# Patient Record
Sex: Female | Born: 1990 | Race: Black or African American | Hispanic: No | Marital: Single | Smoking: Never smoker
Health system: Southern US, Community
[De-identification: ages and names within clinical notes are randomized; demographics above are authoritative.]

## PROBLEM LIST (undated history)

## (undated) DIAGNOSIS — D649 Anemia, unspecified: Secondary | ICD-10-CM

## (undated) DIAGNOSIS — O139 Gestational [pregnancy-induced] hypertension without significant proteinuria, unspecified trimester: Secondary | ICD-10-CM

## (undated) HISTORY — DX: Anemia, unspecified: D64.9

## (undated) HISTORY — PX: NO PAST SURGERIES: SHX2092

## (undated) HISTORY — DX: Gestational (pregnancy-induced) hypertension without significant proteinuria, unspecified trimester: O13.9

---

## 2018-04-16 LAB — CYTOLOGY - PAP: Pap: NEGATIVE

## 2019-11-29 ENCOUNTER — Other Ambulatory Visit: Payer: Self-pay

## 2019-11-29 DIAGNOSIS — Z20822 Contact with and (suspected) exposure to covid-19: Secondary | ICD-10-CM

## 2019-12-01 LAB — NOVEL CORONAVIRUS, NAA: SARS-CoV-2, NAA: NOT DETECTED

## 2021-03-12 ENCOUNTER — Other Ambulatory Visit: Payer: Self-pay

## 2021-03-17 ENCOUNTER — Telehealth: Payer: Self-pay

## 2021-03-17 NOTE — Telephone Encounter (Signed)
Called and spoke with patient in regards a referral order from [facility], Danella Sensing, DO. The referral was to schedule an appointment with a Dietitian.  Patient stated she will call back to schedule the appointment due to work schedule.

## 2021-03-18 ENCOUNTER — Telehealth: Payer: Self-pay | Admitting: Hematology and Oncology

## 2021-03-18 NOTE — Telephone Encounter (Signed)
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 Diana Mcintyre returned my call and has been scheduled to see Dr. Lindi Adie on 4/13 at 4pm. Per pt's request, she needed an appt late in the afternoon due to her work schedule. She's aware to arrive 20 minutes early.

## 2021-03-30 DIAGNOSIS — D563 Thalassemia minor: Secondary | ICD-10-CM | POA: Insufficient documentation

## 2021-03-30 NOTE — Assessment & Plan Note (Deleted)
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

## 2021-03-30 NOTE — Progress Notes (Incomplete)
[facility] CONSULT NOTE  No care team member to display  CHIEF COMPLAINTS/PURPOSE OF CONSULTATION:  Newly diagnosed alpha thalassemia minor  HISTORY OF PRESENTING ILLNESS:  Diana Mcintyre 30 y.o. female is here because of recent diagnosis of alpha thalassemia minor. She is referred by Dr. Lanny Cramp at [facility]. [facility] testing on 02/26/21 showed she is a silent carrier for alpha thalassemia. She presents to the clinic today for initial evaluation.   I reviewed her records extensively and collaborated the history with the patient.  MEDICAL HISTORY:  No past medical history on file.  SURGICAL HISTORY: *** The histories are not reviewed yet. Please review them in the "History" navigator section and refresh this [place].  SOCIAL HISTORY: Social History   Socioeconomic History  . Marital status: Not on file    Spouse name: Not on file  . Number of children: Not on file  . Years of education: Not on file  . Highest education level: Not on file  Occupational History  . Not on file  Tobacco Use  . Smoking status: Not on file  . Smokeless tobacco: Not on file  Substance and Sexual Activity  . Alcohol use: Not on file  . Drug use: Not on file  . Sexual activity: Not on file  Other Topics Concern  . Not on file  Social History Narrative  . Not on file   Social Determinants of Health   Financial Resource Strain: Not on file  Food Insecurity: Not on file  Transportation Needs: Not on file  Physical Activity: Not on file  Stress: Not on file  Social Connections: Not on file  Intimate Partner Violence: Not on file    FAMILY HISTORY: No family history on file.  ALLERGIES:  has no allergies on file.  MEDICATIONS:  No current outpatient medications on file.   No current facility-administered medications for this visit.    REVIEW OF SYSTEMS:   Constitutional: Denies fevers, chills or abnormal night sweats Eyes: Denies blurriness of vision, double  vision or watery eyes Ears, nose, mouth, throat, and face: Denies mucositis or sore throat Respiratory: Denies cough, dyspnea or wheezes Cardiovascular: Denies palpitation, chest discomfort or lower extremity swelling Gastrointestinal:  Denies nausea, heartburn or change in bowel habits Skin: Denies abnormal skin rashes Lymphatics: Denies new lymphadenopathy or easy bruising Neurological:Denies numbness, tingling or new weaknesses Behavioral/Psych: Mood is stable, no new changes  Breast: Denies any palpable lumps or discharge All other systems were reviewed with the patient and are negative.  PHYSICAL EXAMINATION: ECOG PERFORMANCE STATUS: {CHL ONC ECOG [id number]}  There were no vitals filed for this visit. There were no vitals filed for this visit.  GENERAL:alert, no distress and comfortable SKIN: skin color, texture, turgor are normal, no rashes or significant lesions EYES: normal, conjunctiva are pink and non-injected, sclera clear OROPHARYNX:no exudate, no erythema and lips, buccal mucosa, and tongue normal  NECK: supple, thyroid normal size, non-tender, without nodularity LYMPH:  no palpable lymphadenopathy in the cervical, axillary or inguinal LUNGS: clear to auscultation and percussion with normal breathing effort HEART: regular rate & rhythm and no murmurs and no lower extremity edema ABDOMEN:abdomen soft, non-tender and normal bowel sounds Musculoskeletal:no cyanosis of digits and no clubbing  PSYCH: alert & oriented x 3 with fluent speech NEURO: no focal motor/sensory deficits  LABORATORY DATA:  I have reviewed the data as listed No results found for: WBC, HGB, HCT, MCV, PLT No results found for: NA, K, CL, CO2  RADIOGRAPHIC STUDIES:  I have personally reviewed the radiological reports and agreed with the findings in the report.  ASSESSMENT AND PLAN:  No problem-specific Assessment & Plan notes found for this encounter.   All questions were answered. The patient  knows to call the clinic with any problems, questions or concerns.   Rulon Eisenmenger, MD, MPH 03/30/2021    I, Molly Dorshimer, am acting as scribe for Nicholas Lose, MD.  {Add scribe attestation statement}

## 2021-03-31 ENCOUNTER — Telehealth: Payer: Self-pay | Admitting: Physician Assistant

## 2021-03-31 ENCOUNTER — Inpatient Hospital Stay: Payer: BC Managed Care – PPO | Admitting: Hematology and Oncology

## 2021-03-31 DIAGNOSIS — D563 Thalassemia minor: Secondary | ICD-10-CM

## 2021-03-31 NOTE — Telephone Encounter (Signed)
Pt cld to r/s her new hem to 4/15 at 11am to see Murray Hodgkins. Pt aware to arrive 20 minutes early.

## 2021-04-01 NOTE — Progress Notes (Deleted)
[facility] [phone]   [phone]  INITIAL CONSULT NOTE  No care team member to display  Hematological/Oncological History 1) 02/26/2021: [name] Carrier Screen: Silent carrier for alpha thalassemia.  2) 04/01/2021: Establish care with Dede Query PA-C   CHIEF COMPLAINTS/PURPOSE OF CONSULTATION:  "Carrier for alpha thalassemia"  HISTORY OF PRESENTING ILLNESS:  Diana Mcintyre 30 y.o. female with medical history significant for ***  On review of the previous records ***  On exam today ***  MEDICAL HISTORY:  No past medical history on file.  SURGICAL HISTORY: *** The histories are not reviewed yet. Please review them in the "History" navigator section and refresh this [place].  SOCIAL HISTORY: Social History   Socioeconomic History  . Marital status: Not on file    Spouse name: Not on file  . Number of children: Not on file  . Years of education: Not on file  . Highest education level: Not on file  Occupational History  . Not on file  Tobacco Use  . Smoking status: Not on file  . Smokeless tobacco: Not on file  Substance and Sexual Activity  . Alcohol use: Not on file  . Drug use: Not on file  . Sexual activity: Not on file  Other Topics Concern  . Not on file  Social History Narrative  . Not on file   Social Determinants of Health   Financial Resource Strain: Not on file  Food Insecurity: Not on file  Transportation Needs: Not on file  Physical Activity: Not on file  Stress: Not on file  Social Connections: Not on file  Intimate Partner Violence: Not on file    FAMILY HISTORY: No family history on file.  ALLERGIES:  has no allergies on file.  MEDICATIONS:  No current outpatient medications on file.   No current facility-administered medications for this visit.    REVIEW OF SYSTEMS:   Constitutional: ( - ) fevers, ( - )  chills , ( - ) night sweats Eyes: ( - ) blurriness of vision, ( - ) double  vision, ( - ) watery eyes Ears, nose, mouth, throat, and face: ( - ) mucositis, ( - ) sore throat Respiratory: ( - ) cough, ( - ) dyspnea, ( - ) wheezes Cardiovascular: ( - ) palpitation, ( - ) chest discomfort, ( - ) lower extremity swelling Gastrointestinal:  ( - ) nausea, ( - ) heartburn, ( - ) change in bowel habits Skin: ( - ) abnormal skin rashes Lymphatics: ( - ) new lymphadenopathy, ( - ) easy bruising Neurological: ( - ) numbness, ( - ) tingling, ( - ) new weaknesses Behavioral/Psych: ( - ) mood change, ( - ) new changes  All other systems were reviewed with the patient and are negative.  PHYSICAL EXAMINATION: ECOG PERFORMANCE STATUS: {CHL ONC ECOG PS:[id number]}  There were no vitals filed for this visit. There were no vitals filed for this visit.  GENERAL: well appearing *** in NAD  SKIN: skin color, texture, turgor are normal, no rashes or significant lesions EYES: conjunctiva are pink and non-injected, sclera clear OROPHARYNX: no exudate, no erythema; lips, buccal mucosa, and tongue normal  NECK: supple, non-tender LYMPH:  no palpable lymphadenopathy in the cervical, axillary or supraclavicular lymph nodes.  LUNGS: clear to auscultation and percussion with normal breathing effort HEART: regular rate & rhythm and no murmurs and no lower extremity edema ABDOMEN: soft, non-tender, non-distended, normal bowel sounds Musculoskeletal: no cyanosis of digits and no clubbing  PSYCH: alert & oriented x 3, fluent speech NEURO: no focal motor/sensory deficits  LABORATORY DATA:  I have reviewed the data as listed No flowsheet data found.  No flowsheet data found.   PATHOLOGY: ***  BLOOD FILM: *** Review of the peripheral blood smear showed normal appearing white cells with neutrophils that were appropriately lobated and granulated. There was no predominance of bi-lobed or hyper-segmented neutrophils appreciated. No Dohle bodies were noted. There was no left shifting, immature  forms or blasts noted. Lymphocytes remain normal in size without any predominance of large granular lymphocytes. Red cells show no anisopoikilocytosis, macrocytes , microcytes or polychromasia. There were no schistocytes, target cells, echinocytes, acanthocytes, dacrocytes, or stomatocytes.There was no rouleaux formation, nucleated red cells, or intra-cellular inclusions noted. The platelets are normal in size, shape, and color without any clumping evident.  RADIOGRAPHIC STUDIES: I have personally reviewed the radiological images as listed and agreed with the findings in the report. No results found.  ASSESSMENT & PLAN ***  No orders of the defined types were placed in this encounter.   All questions were answered. The patient knows to call the clinic with any problems, questions or concerns.  A total of more than {CHL ONC TIME VISIT - DVVOH:[id number]} were spent on this encounter and over half of that time was spent on counseling and coordination of care as outlined above.    Dede Query, PA-C Department of Hematology/Oncology [facility] Phone: [phone]

## 2021-04-02 ENCOUNTER — Inpatient Hospital Stay: Payer: Self-pay

## 2021-04-02 ENCOUNTER — Inpatient Hospital Stay: Payer: Self-pay | Attending: Hematology and Oncology | Admitting: Physician Assistant

## 2021-05-25 LAB — OB RESULTS CONSOLE ANTIBODY SCREEN: Antibody Screen: NEGATIVE

## 2021-05-25 LAB — OB RESULTS CONSOLE ABO/RH: RH Type: POSITIVE

## 2021-07-14 ENCOUNTER — Encounter: Payer: Self-pay | Admitting: General Practice

## 2021-07-29 ENCOUNTER — Encounter: Payer: Self-pay | Admitting: Obstetrics and Gynecology

## 2021-08-18 ENCOUNTER — Other Ambulatory Visit: Payer: Self-pay

## 2021-08-18 ENCOUNTER — Encounter: Payer: Self-pay | Admitting: Family Medicine

## 2021-08-18 ENCOUNTER — Other Ambulatory Visit (HOSPITAL_COMMUNITY)
Admission: RE | Admit: 2021-08-18 | Discharge: 2021-08-18 | Disposition: A | Discharge status: Home / Self Care | Payer: Medicaid Other | Source: Ambulatory Visit | Attending: Family Medicine | Admitting: Family Medicine

## 2021-08-18 ENCOUNTER — Ambulatory Visit (INDEPENDENT_AMBULATORY_CARE_PROVIDER_SITE_OTHER): Payer: Medicaid Other | Admitting: Family Medicine

## 2021-08-18 VITALS — BP 92/63 | HR 77 | Ht 64.0 in | Wt 193.4 lb

## 2021-08-18 DIAGNOSIS — D563 Thalassemia minor: Secondary | ICD-10-CM

## 2021-08-18 DIAGNOSIS — Z148 Genetic carrier of other disease: Secondary | ICD-10-CM | POA: Insufficient documentation

## 2021-08-18 DIAGNOSIS — Z3402 Encounter for supervision of normal first pregnancy, second trimester: Secondary | ICD-10-CM | POA: Diagnosis present

## 2021-08-18 DIAGNOSIS — Z3A15 15 weeks gestation of pregnancy: Secondary | ICD-10-CM

## 2021-08-18 DIAGNOSIS — Z3400 Encounter for supervision of normal first pregnancy, unspecified trimester: Secondary | ICD-10-CM | POA: Insufficient documentation

## 2021-08-18 MED ORDER — BLOOD PRESSURE KIT DEVI: 1.0000 | 0 refills | Status: DC

## 2021-08-18 NOTE — Patient Instructions (Signed)
Second Trimester of Pregnancy The second trimester of pregnancy is from week 13 through week 27. This is months 4 through 6 of pregnancy. The second trimester is often a time when you feel your best. Your body has adjusted to being pregnant, and you begin to feel better physically. During the second trimester: Morning sickness has lessened or stopped completely. You may have more energy. You may have an increase in appetite. The second trimester is also a time when the unborn baby (fetus) is growing rapidly. At the end of the sixth month, the fetus may be up to 12 inches long and weigh about 1 pounds. You will likely begin to feel the baby move (quickening) between 16 and 20 weeks of pregnancy. Body changes during your second trimester Your body continues to go through many changes during your second trimester. The changes vary and generally return to normal after the baby is born. Physical changes Your weight will continue to increase. You will notice your lower abdomen bulging out. You may begin to get stretch marks on your hips, abdomen, and breasts. Your breasts will continue to grow and to become tender. Dark spots or blotches (chloasma or mask of pregnancy) may develop on your face. A dark line from your belly button to the pubic area (linea nigra) may appear. You may have changes in your hair. These can include thickening of your hair, rapid growth, and changes in texture. Some people also have hair loss during or after pregnancy, or hair that feels dry or thin. Health changes You may develop headaches. You may have heartburn. You may develop constipation. You may develop hemorrhoids or swollen, bulging veins (varicose veins). Your gums may bleed and may be sensitive to brushing and flossing. You may urinate more often because the fetus is pressing on your bladder. You may have back pain. This is caused by: Weight gain. Pregnancy hormones that are relaxing the joints in your  pelvis. A shift in weight and the muscles that support your balance. Follow these instructions at home: Medicines Follow your health care provider's instructions regarding medicine use. Specific medicines may be either safe or unsafe to take during pregnancy. Do not take any medicines unless approved by your health care provider. Take a prenatal vitamin that contains at least 600 micrograms (mcg) of folic acid. Eating and drinking Eat a healthy diet that includes fresh fruits and vegetables, whole grains, good sources of protein such as meat, eggs, or tofu, and low-fat dairy products. Avoid raw meat and unpasteurized juice, milk, and cheese. These carry germs that can harm you and your baby. You may need to take these actions to prevent or treat constipation: Drink enough fluid to keep your urine pale yellow. Eat foods that are high in fiber, such as beans, whole grains, and fresh fruits and vegetables. Limit foods that are high in fat and processed sugars, such as fried or sweet foods. Activity Exercise only as directed by your health care provider. Most people can continue their usual exercise routine during pregnancy. Try to exercise for 30 minutes at least 5 days a week. Stop exercising if you develop contractions in your uterus. Stop exercising if you develop pain or cramping in the lower abdomen or lower back. Avoid exercising if it is very hot or humid or if you are at a high altitude. Avoid heavy lifting. If you choose to, you may have sex unless your health care provider tells you not to. Relieving pain and discomfort Wear a supportive bra  to prevent discomfort from breast tenderness. Take warm sitz baths to soothe any pain or discomfort caused by hemorrhoids. Use hemorrhoid cream if your health care provider approves. Rest with your legs raised (elevated) if you have leg cramps or low back pain. If you develop varicose veins: Wear support hose as told by your health care  provider. Elevate your feet for 15 minutes, 3-4 times a day. Limit salt in your diet. Safety Wear your seat belt at all times when driving or riding in a car. Talk with your health care provider if someone is verbally or physically abusive to you. Lifestyle Do not use hot tubs, steam rooms, or saunas. Do not douche. Do not use tampons or scented sanitary pads. Avoid cat litter boxes and soil used by cats. These carry germs that can cause birth defects in the baby and possibly loss of the fetus by miscarriage or stillbirth. Do not use herbal remedies, alcohol, illegal drugs, or medicines that are not approved by your health care provider. Chemicals in these products can harm your baby. Do not use any products that contain nicotine or tobacco, such as cigarettes, e-cigarettes, and chewing tobacco. If you need help quitting, ask your health care provider. General instructions During a routine prenatal visit, your health care provider will do a physical exam and other tests. He or she will also discuss your overall health. Keep all follow-up visits. This is important. Ask your health care provider for a referral to a local prenatal education class. Ask for help if you have counseling or nutritional needs during pregnancy. Your health care provider can offer advice or refer you to specialists for help with various needs. Where to find more information American Pregnancy Association: americanpregnancy.[place] and Gynecologists: PoolDevices.com.pt Office on [employer]: KeywordPortfolios.com.br Contact a health care provider if you have: A headache that does not go away when you take medicine. Vision changes or you see spots in front of your eyes. Mild pelvic cramps, pelvic pressure, or nagging pain in the abdominal area. Persistent nausea, vomiting, or diarrhea. A bad-smelling vaginal discharge or foul-smelling urine. Pain when you  urinate. Sudden or extreme swelling of your face, hands, ankles, feet, or legs. A fever. Get help right away if you: Have fluid leaking from your vagina. Have spotting or bleeding from your vagina. Have severe abdominal cramping or pain. Have difficulty breathing. Have chest pain. Have fainting spells. Have not felt your baby move for the time period told by your health care provider. Have new or increased pain, swelling, or redness in an arm or leg. Summary The second trimester of pregnancy is from week 13 through week 27 (months 4 through 6). Do not use herbal remedies, alcohol, illegal drugs, or medicines that are not approved by your health care provider. Chemicals in these products can harm your baby. Exercise only as directed by your health care provider. Most people can continue their usual exercise routine during pregnancy. Keep all follow-up visits. This is important. This information is not intended to replace advice given to you by your health care provider. Make sure you discuss any questions you have with your health care provider. Document Revised: 05/13/2020 Document Reviewed: 03/19/2020 Elsevier Patient Education  2022 [employer].  Contraception Choices Contraception, also called birth control, refers to methods or devices that prevent pregnancy. Hormonal methods Contraceptive implant A contraceptive implant is a thin, plastic tube that contains a hormone that prevents pregnancy. It is different from an intrauterine device (IUD). It  is inserted into the upper part of the arm by a health care provider. Implants can be effective for up to 3 years. Progestin-only injections Progestin-only injections are injections of progestin, a synthetic form of the hormone progesterone. They are given every 3 months by a health care provider. Birth control pills Birth control pills are pills that contain hormones that prevent pregnancy. They must be taken once a day, preferably at the  same time each day. A prescription is needed to use this method of contraception. Birth control patch The birth control patch contains hormones that prevent pregnancy. It is placed on the skin and must be changed once a week for three weeks and removed on the fourth week. A prescription is needed to use this method of contraception. Vaginal ring A vaginal ring contains hormones that prevent pregnancy. It is placed in the vagina for three weeks and removed on the fourth week. After that, the process is repeated with a new ring. A prescription is needed to use this method of contraception. Emergency contraceptive Emergency contraceptives prevent pregnancy after unprotected sex. They come in pill form and can be taken up to 5 days after sex. They work best the sooner they are taken after having sex. Most emergency contraceptives are available without a prescription. This method should not be used as your only form of birth control. Barrier methods Female condom A female condom is a thin sheath that is worn over the penis during sex. Condoms keep sperm from going inside a woman's body. They can be used with a sperm-killing substance (spermicide) to increase their effectiveness. They should be thrown away after one use. Female condom A female condom is a soft, loose-fitting sheath that is put into the vagina before sex. The condom keeps sperm from going inside a woman's body. They should be thrown away after one use. Diaphragm A diaphragm is a soft, dome-shaped barrier. It is inserted into the vagina before sex, along with a spermicide. The diaphragm blocks sperm from entering the uterus, and the spermicide kills sperm. A diaphragm should be left in the vagina for 6-8 hours after sex and removed within 24 hours. A diaphragm is prescribed and fitted by a health care provider. A diaphragm should be replaced every 1-2 years, after giving birth, after gaining more than 15 lb (6.8 kg), and after pelvic  surgery. Cervical cap A cervical cap is a round, soft latex or plastic cup that fits over the cervix. It is inserted into the vagina before sex, along with spermicide. It blocks sperm from entering the uterus. The cap should be left in place for 6-8 hours after sex and removed within 48 hours. A cervical cap must be prescribed and fitted by a health care provider. It should be replaced every 2 years. Sponge A sponge is a soft, circular piece of polyurethane foam with spermicide in it. The sponge helps block sperm from entering the uterus, and the spermicide kills sperm. To use it, you make it wet and then insert it into the vagina. It should be inserted before sex, left in for at least 6 hours after sex, and removed and thrown away within 30 hours. Spermicides Spermicides are chemicals that kill or block sperm from entering the cervix and uterus. They can come as a cream, jelly, suppository, foam, or tablet. A spermicide should be inserted into the vagina with an applicator at least 82-50 minutes before sex to allow time for it to work. The process must be repeated every time  you have sex. Spermicides do not require a prescription. Intrauterine contraception Intrauterine device (IUD) An IUD is a T-shaped device that is put in a woman's uterus. There are two types: Hormone IUD.This type contains progestin, a synthetic form of the hormone progesterone. This type can stay in place for 3-5 years. Copper IUD.This type is wrapped in copper wire. It can stay in place for 10 years. Permanent methods of contraception Female tubal ligation In this method, a woman's fallopian tubes are sealed, tied, or blocked during surgery to prevent eggs from traveling to the uterus. Hysteroscopic sterilization In this method, a small, flexible insert is placed into each fallopian tube. The inserts cause scar tissue to form in the fallopian tubes and block them, so sperm cannot reach an egg. The procedure takes about 3  months to be effective. Another form of birth control must be used during those 3 months. Female sterilization This is a procedure to tie off the tubes that carry sperm (vasectomy). After the procedure, the man can still ejaculate fluid (semen). Another form of birth control must be used for 3 months after the procedure. Natural planning methods Natural family planning In this method, a couple does not have sex on days when the woman could become pregnant. Calendar method In this method, the woman keeps track of the length of each menstrual cycle, identifies the days when pregnancy can happen, and does not have sex on those days. Ovulation method In this method, a couple avoids sex during ovulation. Symptothermal method This method involves not having sex during ovulation. The woman typically checks for ovulation by watching changes in her temperature and in the consistency of cervical mucus. Post-ovulation method In this method, a couple waits to have sex until after ovulation. Where to find more information Centers for Disease Control and Prevention: http://www.wolf.info/ Summary Contraception, also called birth control, refers to methods or devices that prevent pregnancy. Hormonal methods of contraception include implants, injections, pills, patches, vaginal rings, and emergency contraceptives. Barrier methods of contraception can include female condoms, female condoms, diaphragms, cervical caps, sponges, and spermicides. There are two types of IUDs (intrauterine devices). An IUD can be put in a woman's uterus to prevent pregnancy for 3-5 years. Permanent sterilization can be done through a procedure for males and females. Natural family planning methods involve nothaving sex on days when the woman could become pregnant. This information is not intended to replace advice given to you by your health care provider. Make sure you discuss any questions you have with your health care provider. Document  Revised: 05/11/2020 Document Reviewed: 05/11/2020 Elsevier Patient Education  [place].

## 2021-08-18 NOTE — Progress Notes (Signed)
[facility] New OB   Subjective:   Diana Mcintyre is a 30 y.o. G1P0000 at 42w6dby home IUI being seen today for her first obstetrical visit.  Her obstetrical history is significant for  n/a . Patient does intend to breast feed. Pregnancy history fully reviewed.  Patient reports no complaints.  HISTORY: OB History  Gravida Para Term Preterm AB Living  1 0 0 0 0 0  SAB IAB Ectopic Multiple Live Births  0 0 0 0 0    # Outcome Date GA Lbr Len/2nd Weight Sex Delivery Anes PTL Lv  1 Current              Last pap smear was  03/2018 and was  normal.   Past Medical History:  Diagnosis Date   Anemia    History reviewed. No pertinent surgical history. Family History  Adopted: Yes  Problem Relation Age of Onset   Diabetes Paternal Grandfather    Cancer Mother    Diabetes Sister    Social History   Tobacco Use   Smoking status: Never   Smokeless tobacco: Never  Vaping Use   Vaping Use: Never used  Substance Use Topics   Alcohol use: Not Currently    Alcohol/week: 1.0 standard drink    Types: 1 Glasses of wine per week   Drug use: Never   No Known Allergies Current Outpatient Medications on File Prior to Visit  Medication Sig Dispense Refill   Prenatal Vit-Fe Fumarate-FA (MULTIVITAMIN-PRENATAL) 27-0.8 MG TABS tablet Take 1 tablet by mouth daily at 12 noon.     No current facility-administered medications on file prior to visit.     Exam   Vitals:   08/18/21 1400 08/18/21 1405  BP: 92/63   Pulse: 77   Weight: 193 lb 6.4 oz (87.7 kg)   Height:  '5\' 4"'$  (1.626 m)   Fetal Heart Rate (bpm): 140  Uterus:     Pelvic Exam: Perineum: no hemorrhoids, normal perineum   Vulva: normal external genitalia, no lesions   Vagina:  normal mucosa mild amount of white discharge but denies any symptoms   Cervix: no lesions and normal, pap smear done.   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 Encounter for supervision of normal first pregnancy in second trimester Initial labs drawn. Pap collected today Continue prenatal vitamins. Genetic Screening discussed, NIPS: ordered. Ultrasound discussed; fetal anatomic survey: ordered. Problem list reviewed and updated. [facility] and other [facility] Providers was explained to patient; also emphasized that residents, students are part of our team.  2. Carrier of spinal muscular atrophy 3. Alpha thalassemia minor carrier Sperm donor was screened and does not carry these genes  Routine obstetric precautions reviewed. Return in 4 weeks (on  09/15/2021) for Dyad patient, ob visit.

## 2021-08-19 LAB — CYTOLOGY - PAP
Chlamydia: NEGATIVE
Comment: NEGATIVE
Comment: NEGATIVE
Comment: NEGATIVE
Comment: NORMAL
Diagnosis: NEGATIVE
High risk HPV: NEGATIVE
Neisseria Gonorrhea: NEGATIVE
Trichomonas: NEGATIVE

## 2021-08-19 LAB — CBC/D/PLT+RPR+RH+ABO+RUBIGG...
Antibody Screen: NEGATIVE
Basophils Absolute: 0 10*3/uL (ref 0.0–0.2)
Basos: 0 %
EOS (ABSOLUTE): 0 10*3/uL (ref 0.0–0.4)
Eos: 0 %
HCV Ab: <0.1 s/co ratio (ref 0.0–0.9)
HIV Screen 4th Generation wRfx: NONREACTIVE
Hematocrit: 41.2 % (ref 34.0–46.6)
Hemoglobin: 13.7 g/dL (ref 11.1–15.9)
Hepatitis B Surface Ag: NEGATIVE
Immature Grans (Abs): 0 10*3/uL (ref 0.0–0.1)
Immature Granulocytes: 1 %
Lymphocytes Absolute: 1.5 10*3/uL (ref 0.7–3.1)
Lymphs: 21 %
MCH: 26.4 pg — ABNORMAL LOW (ref 26.6–33.0)
MCHC: 33.3 g/dL (ref 31.5–35.7)
MCV: 79 fL (ref 79–97)
Monocytes Absolute: 0.5 10*3/uL (ref 0.1–0.9)
Monocytes: 7 %
Neutrophils Absolute: 5.3 10*3/uL (ref 1.4–7.0)
Neutrophils: 71 %
Platelets: 221 10*3/uL (ref 150–450)
RBC: 5.19 x10E6/uL (ref 3.77–5.28)
RDW: 14.5 % (ref 11.7–15.4)
RPR Ser Ql: NONREACTIVE
Rh Factor: POSITIVE
Rubella Antibodies, IGG: 1.3 index (ref >0.99)
WBC: 7.3 10*3/uL (ref 3.4–10.8)

## 2021-08-19 LAB — HEMOGLOBIN A1C
Est. average glucose Bld gHb Est-mCnc: 103 mg/dL
Hgb A1c MFr Bld: 5.2 % (ref 4.8–5.6)

## 2021-08-19 LAB — HCV INTERPRETATION

## 2021-08-20 LAB — CULTURE, OB URINE

## 2021-08-20 LAB — URINE CULTURE, OB REFLEX

## 2021-09-02 DIAGNOSIS — Z3483 Encounter for supervision of other normal pregnancy, third trimester: Secondary | ICD-10-CM | POA: Diagnosis not present

## 2021-09-02 DIAGNOSIS — Z3482 Encounter for supervision of other normal pregnancy, second trimester: Secondary | ICD-10-CM | POA: Diagnosis not present

## 2021-09-06 ENCOUNTER — Encounter: Payer: Self-pay | Admitting: General Practice

## 2021-09-09 ENCOUNTER — Ambulatory Visit (HOSPITAL_BASED_OUTPATIENT_CLINIC_OR_DEPARTMENT_OTHER): Payer: Medicaid Other | Admitting: Maternal & Fetal Medicine

## 2021-09-09 ENCOUNTER — Other Ambulatory Visit: Payer: Self-pay

## 2021-09-09 ENCOUNTER — Other Ambulatory Visit: Payer: Self-pay | Admitting: Family Medicine

## 2021-09-09 ENCOUNTER — Other Ambulatory Visit: Payer: Self-pay | Admitting: *Deleted

## 2021-09-09 ENCOUNTER — Ambulatory Visit: Payer: Medicaid Other | Attending: Family Medicine

## 2021-09-09 DIAGNOSIS — O283 Abnormal ultrasonic finding on antenatal screening of mother: Secondary | ICD-10-CM | POA: Diagnosis not present

## 2021-09-09 DIAGNOSIS — O43192 Other malformation of placenta, second trimester: Secondary | ICD-10-CM

## 2021-09-09 DIAGNOSIS — Z3402 Encounter for supervision of normal first pregnancy, second trimester: Secondary | ICD-10-CM

## 2021-09-09 NOTE — Progress Notes (Signed)
MFM Brief Note  Diana Mcintyre is a 30 yo G1P0 who is here with a single intrauterine pregnancy for a detailed anatomy due to carrier status for alpha thalassemia and SMA.  She had a low risk NIPS.  Normal anatomy with measurements consistent with dates There is good fetal movement and amniotic fluid volume  An echogenic intracardiac focus was observed today. I discussed that there is no increased risk to the function or structure of the heart. In addition, in the context of a low risk NIPS result the risk for aneuploidy are reduced. There were no additional markers of aneuploidy observed.  I reviewed that an ultrasound is a screening exam and that a diagnostic exam via amnioticenetesis is the only test available to provide a definitive result.   A marginal cord insertion was observed today. The placental insertion was 9 mm from the placental edge. We discussed the association of MCI with fetal growth delays. Therefore we recommend repeat growth every 4-6 weeks.  Follow up growth was scheduled in 4-6 weeks.  I spent 30 minutes with > 50% in face to face consultation.  Diana Ports, MD

## 2021-09-10 ENCOUNTER — Encounter: Payer: Self-pay | Admitting: Family Medicine

## 2021-09-10 DIAGNOSIS — O43192 Other malformation of placenta, second trimester: Secondary | ICD-10-CM | POA: Insufficient documentation

## 2021-09-16 ENCOUNTER — Other Ambulatory Visit: Payer: Self-pay

## 2021-09-16 ENCOUNTER — Ambulatory Visit (INDEPENDENT_AMBULATORY_CARE_PROVIDER_SITE_OTHER): Payer: Medicaid Other | Admitting: Family Medicine

## 2021-09-16 VITALS — BP 110/65 | HR 73 | Wt 202.6 lb

## 2021-09-16 DIAGNOSIS — Z3400 Encounter for supervision of normal first pregnancy, unspecified trimester: Secondary | ICD-10-CM

## 2021-09-16 DIAGNOSIS — O43192 Other malformation of placenta, second trimester: Secondary | ICD-10-CM

## 2021-09-16 DIAGNOSIS — O99210 Obesity complicating pregnancy, unspecified trimester: Secondary | ICD-10-CM | POA: Insufficient documentation

## 2021-09-16 NOTE — Progress Notes (Signed)
   PRENATAL VISIT NOTE  Subjective:  Diana Mcintyre is a 30 y.o. G1P0000 at [redacted]w[redacted]d being seen today for ongoing prenatal care.  She is currently monitored for the following issues for this low-risk pregnancy and has Alpha thalassemia minor; Carrier of spinal muscular atrophy; Supervision of normal first pregnancy, antepartum; Marginal insertion of umbilical cord affecting management of mother in second trimester; and Obesity in pregnancy, antepartum on their problem list.  Patient reports no complaints.  Contractions: Not present. Vag. Bleeding: None.  Movement: Present. Denies leaking of fluid.   The following portions of the patient's history were reviewed and updated as appropriate: allergies, current medications, past family history, past medical history, past social history, past surgical history and problem list.   Objective:   Vitals:   09/16/21 0817  BP: 110/65  Pulse: 73  Weight: 202 lb 9.6 oz (91.9 kg)    Fetal Status: Fetal Heart Rate (bpm): 137   Movement: Present     General:  Alert, oriented and cooperative. Patient is in no acute distress.  Skin: Skin is warm and dry. No rash noted.   Cardiovascular: Normal heart rate noted  Respiratory: Normal respiratory effort, no problems with respiration noted  Abdomen: Soft, gravid, appropriate for gestational age.  Pain/Pressure: Absent     Pelvic: Cervical exam deferred        Extremities: Normal range of motion.  Edema: None  Mental Status: Normal mood and affect. Normal behavior. Normal judgment and thought content.   Assessment and Plan:  Pregnancy: G1P0000 at [redacted]w[redacted]d  1. Marginal insertion of umbilical cord affecting management of mother in second trimester Growth [place] scheduled   2. Supervision of normal first pregnancy, antepartum Up to date Reviewed safety of exercise in pregnancy Reviewed importance of small frequent meals with high protein for steady weight can.  TWG=33 lb 9.6 oz (15.2 kg) which is above goal.  Discussed ways to slow weight gain.  Recommended back health stretches and pregnancy yoga   Preterm labor symptoms and general obstetric precautions including but not limited to vaginal bleeding, contractions, leaking of fluid and fetal movement were reviewed in detail with the patient. Please refer to After Visit Summary for other counseling recommendations.   Return in about 4 weeks (around 10/14/2021) for Routine prenatal care, Dual Care-MB Dyad.  Future Appointments  Date Time Provider [place]  10/08/2021  9:00 AM [facility] NURSE [facility]  10/08/2021  9:15 AM [facility] US2 [facility] [place]    Caren Macadam, MD

## 2021-09-24 ENCOUNTER — Encounter: Payer: Self-pay | Admitting: Radiology

## 2021-10-08 ENCOUNTER — Ambulatory Visit: Payer: Medicaid Other | Attending: Maternal & Fetal Medicine

## 2021-10-08 ENCOUNTER — Other Ambulatory Visit: Payer: Self-pay | Admitting: *Deleted

## 2021-10-08 ENCOUNTER — Other Ambulatory Visit: Payer: Self-pay

## 2021-10-08 ENCOUNTER — Encounter: Payer: Self-pay | Admitting: *Deleted

## 2021-10-08 ENCOUNTER — Ambulatory Visit: Payer: Medicaid Other | Admitting: *Deleted

## 2021-10-08 VITALS — BP 117/60 | HR 75

## 2021-10-08 DIAGNOSIS — Z3400 Encounter for supervision of normal first pregnancy, unspecified trimester: Secondary | ICD-10-CM | POA: Diagnosis present

## 2021-10-08 DIAGNOSIS — O43192 Other malformation of placenta, second trimester: Secondary | ICD-10-CM | POA: Diagnosis not present

## 2021-10-08 DIAGNOSIS — O43199 Other malformation of placenta, unspecified trimester: Secondary | ICD-10-CM

## 2021-10-08 DIAGNOSIS — R638 Other symptoms and signs concerning food and fluid intake: Secondary | ICD-10-CM

## 2021-10-08 DIAGNOSIS — O99210 Obesity complicating pregnancy, unspecified trimester: Secondary | ICD-10-CM

## 2021-10-08 DIAGNOSIS — Z3A23 23 weeks gestation of pregnancy: Secondary | ICD-10-CM | POA: Diagnosis not present

## 2021-10-08 DIAGNOSIS — O358XX0 Maternal care for other (suspected) fetal abnormality and damage, not applicable or unspecified: Secondary | ICD-10-CM

## 2021-10-15 ENCOUNTER — Ambulatory Visit (INDEPENDENT_AMBULATORY_CARE_PROVIDER_SITE_OTHER): Payer: Medicaid Other | Admitting: Family Medicine

## 2021-10-15 ENCOUNTER — Other Ambulatory Visit: Payer: Self-pay

## 2021-10-15 VITALS — BP 105/70 | HR 88 | Wt 205.0 lb

## 2021-10-15 DIAGNOSIS — D563 Thalassemia minor: Secondary | ICD-10-CM

## 2021-10-15 DIAGNOSIS — O43192 Other malformation of placenta, second trimester: Secondary | ICD-10-CM

## 2021-10-15 DIAGNOSIS — Z148 Genetic carrier of other disease: Secondary | ICD-10-CM

## 2021-10-15 DIAGNOSIS — Z3400 Encounter for supervision of normal first pregnancy, unspecified trimester: Secondary | ICD-10-CM

## 2021-10-15 DIAGNOSIS — O99210 Obesity complicating pregnancy, unspecified trimester: Secondary | ICD-10-CM

## 2021-10-15 NOTE — Patient Instructions (Signed)

## 2021-10-15 NOTE — Progress Notes (Signed)
   Subjective:  Diana Mcintyre is a 30 y.o. G1P0000 at [redacted]w[redacted]d being seen today for ongoing prenatal care.  She is currently monitored for the following issues for this high-risk pregnancy and has Alpha thalassemia minor; Carrier of spinal muscular atrophy; Supervision of normal first pregnancy, antepartum; Marginal insertion of umbilical cord affecting management of mother in second trimester; and Obesity in pregnancy, antepartum on their problem list.  Patient reports no complaints.  Contractions: Not present. Vag. Bleeding: None.  Movement: Present. Denies leaking of fluid.   The following portions of the patient's history were reviewed and updated as appropriate: allergies, current medications, past family history, past medical history, past social history, past surgical history and problem list. Problem list updated.  Objective:   Vitals:   10/15/21 1056  BP: 105/70  Pulse: 88  Weight: 205 lb (93 kg)    Fetal Status: Fetal Heart Rate (bpm): 152   Movement: Present     General:  Alert, oriented and cooperative. Patient is in no acute distress.  Skin: Skin is warm and dry. No rash noted.   Cardiovascular: Normal heart rate noted  Respiratory: Normal respiratory effort, no problems with respiration noted  Abdomen: Soft, gravid, appropriate for gestational age. Pain/Pressure: Absent     Pelvic: Vag. Bleeding: None     Cervical exam deferred        Extremities: Normal range of motion.  Edema: None  Mental Status: Normal mood and affect. Normal behavior. Normal judgment and thought content.   Urinalysis:      Assessment and Plan:  Pregnancy: G1P0000 at [redacted]w[redacted]d  1. Supervision of normal first pregnancy, antepartum BP and FHR normal Discussed delivery planning preliminarily, she is interested in an unmedicated birth and possibly not getting an IV Stressed we will support her wishes whatever they are but would strongly recommend an IV given the unpredictable nature of L&D She will  consider Also discussed available pain options in case she decides against an unmedicated birth  2. Obesity in pregnancy, antepartum   3. Marginal insertion of umbilical cord affecting management of mother in second trimester Last growth [place] normal, following w MFM  Preterm labor symptoms and general obstetric precautions including but not limited to vaginal bleeding, contractions, leaking of fluid and fetal movement were reviewed in detail with the patient. Please refer to After Visit Summary for other counseling recommendations.  Return in 4 weeks (on 11/12/2021) for Dyad patient, ob visit, 28 wk labs.   Clarnce Flock, MD

## 2021-10-22 ENCOUNTER — Inpatient Hospital Stay (HOSPITAL_COMMUNITY)
Admission: AD | Admit: 2021-10-22 | Discharge: 2021-10-22 | Disposition: A | Discharge status: Home / Self Care | Payer: Medicaid Other | Attending: Obstetrics & Gynecology | Admitting: Obstetrics & Gynecology

## 2021-10-22 ENCOUNTER — Other Ambulatory Visit: Payer: Self-pay

## 2021-10-22 ENCOUNTER — Encounter (HOSPITAL_COMMUNITY): Payer: Self-pay | Admitting: Obstetrics & Gynecology

## 2021-10-22 DIAGNOSIS — O99210 Obesity complicating pregnancy, unspecified trimester: Secondary | ICD-10-CM

## 2021-10-22 DIAGNOSIS — O1202 Gestational edema, second trimester: Secondary | ICD-10-CM | POA: Diagnosis not present

## 2021-10-22 DIAGNOSIS — Z3400 Encounter for supervision of normal first pregnancy, unspecified trimester: Secondary | ICD-10-CM

## 2021-10-22 DIAGNOSIS — Z3A25 25 weeks gestation of pregnancy: Secondary | ICD-10-CM | POA: Diagnosis not present

## 2021-10-22 DIAGNOSIS — R609 Edema, unspecified: Secondary | ICD-10-CM

## 2021-10-22 LAB — COMPREHENSIVE METABOLIC PANEL
ALT: 14 U/L (ref 0–44)
AST: 24 U/L (ref 15–41)
Albumin: 2.7 g/dL — ABNORMAL LOW (ref 3.5–5.0)
Alkaline Phosphatase: 45 U/L (ref 38–126)
Anion gap: 6 (ref 5–15)
BUN: 6 mg/dL (ref 6–20)
CO2: 21 mmol/L — ABNORMAL LOW (ref 22–32)
Calcium: 8.5 mg/dL — ABNORMAL LOW (ref 8.9–10.3)
Chloride: 106 mmol/L (ref 98–111)
Creatinine, Ser: 0.57 mg/dL (ref 0.44–1.00)
GFR, Estimated: >60 mL/min (ref >60)
Glucose, Bld: 91 mg/dL (ref 70–99)
Potassium: 3.7 mmol/L (ref 3.5–5.1)
Sodium: 133 mmol/L — ABNORMAL LOW (ref 135–145)
Total Bilirubin: 0.3 mg/dL (ref 0.3–1.2)
Total Protein: 6 g/dL — ABNORMAL LOW (ref 6.5–8.1)

## 2021-10-22 LAB — URINALYSIS, ROUTINE W REFLEX MICROSCOPIC
Bilirubin Urine: NEGATIVE
Glucose, UA: NEGATIVE mg/dL
Hgb urine dipstick: NEGATIVE
Ketones, ur: NEGATIVE mg/dL
Leukocytes,Ua: NEGATIVE
Nitrite: NEGATIVE
Protein, ur: NEGATIVE mg/dL
Specific Gravity, Urine: 1.014 (ref 1.005–1.030)
pH: 6 (ref 5.0–8.0)

## 2021-10-22 LAB — CBC
HCT: 34.4 % — ABNORMAL LOW (ref 36.0–46.0)
Hemoglobin: 11.3 g/dL — ABNORMAL LOW (ref 12.0–15.0)
MCH: 26.5 pg (ref 26.0–34.0)
MCHC: 32.8 g/dL (ref 30.0–36.0)
MCV: 80.8 fL (ref 80.0–100.0)
Platelets: 181 10*3/uL (ref 150–400)
RBC: 4.26 MIL/uL (ref 3.87–5.11)
RDW: 13.2 % (ref 11.5–15.5)
WBC: 7.8 10*3/uL (ref 4.0–10.5)
nRBC: 0 % (ref 0.0–0.2)

## 2021-10-22 LAB — PROTEIN / CREATININE RATIO, URINE
Creatinine, Urine: 127.08 mg/dL
Protein Creatinine Ratio: 0.09 mg/mg{Cre} (ref 0.00–0.15)
Total Protein, Urine: 11 mg/dL

## 2021-10-22 NOTE — MAU Provider Note (Signed)
History     CSN: [id number]  Arrival date and time: 10/22/21 1424   Event Date/Time   First Provider Initiated Contact with Patient 10/22/21 1500      Chief Complaint  Patient presents with   swelling   HPI Diana Mcintyre is a 30 y.o. G1P0 at 75w1dhere for swelling in ankles and hands. Patient reports she noticed an increase in swelling this morning, however since then she feels as if swelling has decreased. She denies any BP issues, no headache, vision changes, or RUQ/epigastric pain. Denies contractions, leaking fluid, or vaginal bleeding. Endorses active fetal movement.   OB History     Gravida  1   Para  0   Term  0   Preterm  0   AB  0   Living  0      SAB  0   IAB  0   Ectopic  0   Multiple  0   Live Births  0           Past Medical History:  Diagnosis Date   Anemia     Past Surgical History:  Procedure Laterality Date   NO PAST SURGERIES      Family History  Adopted: Yes  Problem Relation Age of Onset   Diabetes Paternal Grandfather    Cancer Mother    Diabetes Sister     Social History   Tobacco Use   Smoking status: Never   Smokeless tobacco: Never  Vaping Use   Vaping Use: Never used  Substance Use Topics   Alcohol use: Not Currently    Alcohol/week: 1.0 standard drink    Types: 1 Glasses of wine per week   Drug use: Never    Allergies: No Known Allergies  Medications Prior to Admission  Medication Sig Dispense Refill Last Dose   Blood Pressure Monitoring (BLOOD PRESSURE KIT) DEVI 1 Device by Does not apply route once a week. 1 each 0    Prenatal Vit-Fe Fumarate-FA (MULTIVITAMIN-PRENATAL) 27-0.8 MG TABS tablet Take 1 tablet by mouth daily at 12 noon.       Review of Systems  Constitutional: Negative.   Respiratory: Negative.    Cardiovascular:        Swelling in ankles and hands   Gastrointestinal: Negative.   Genitourinary: Negative.   Musculoskeletal: Negative.   Neurological: Negative.   Physical Exam    Blood pressure 116/60, pulse 78, temperature 99.2 F (37.3 C), temperature source Oral, resp. rate 16, last menstrual period 04/29/2021, SpO2 100 %.  Physical Exam Constitutional:      Appearance: She is obese.  HENT:     Head: Normocephalic and atraumatic.  Eyes:     Pupils: Pupils are equal, round, and reactive to light.  Cardiovascular:     Rate and Rhythm: Normal rate.  Pulmonary:     Effort: Pulmonary effort is normal.  Abdominal:     General: Abdomen is flat.     Palpations: Abdomen is soft.  Musculoskeletal:        General: Swelling (+1 swelling in lower extremities) present. Normal range of motion.  Skin:    General: Skin is warm and dry.  Neurological:     General: No focal deficit present.     Mental Status: She is alert and oriented to person, place, and time.  Psychiatric:        Mood and Affect: Mood normal.        Behavior: Behavior normal.  Thought Content: Thought content normal.   NST FHR: 140 bpm, moderate variability, +accels, no decels Toco: quiet MAU Course  Procedures  MDM CBC, CMP, UPCR wnl BP's normotensive NST reactive and reassuring for gestational age  Assessment and Plan  Swelling [redacted] weeks gestation of pregnancy  - Recommend elevate legs, hydrate, compression socks - Keep OB appointment as scheduled - Strict return precautions reviewed. Return to MAU as needed for worsening symptoms   Renee Harder, CNM 10/22/2021, 4:49 PM

## 2021-10-22 NOTE — MAU Note (Signed)
Diana Mcintyre is a 30 y.o. at [redacted]w[redacted]d here in MAU reporting: increased swelling in hands and ankles since last night. No HTN. No pain, bleeding, or LOF. +FM  Onset of complaint: last night  Pain score: 0/10  Vitals:   10/22/21 1447  BP: 117/68  Pulse: 82  Resp: 16  Temp: 99.2 F (37.3 C)  SpO2: 98%     FHT:efm applied in room  Lab orders placed from triage: UA

## 2021-11-01 ENCOUNTER — Encounter: Payer: Self-pay | Admitting: *Deleted

## 2021-11-19 ENCOUNTER — Other Ambulatory Visit: Payer: Self-pay

## 2021-11-19 ENCOUNTER — Ambulatory Visit (INDEPENDENT_AMBULATORY_CARE_PROVIDER_SITE_OTHER): Payer: Medicaid Other | Admitting: Family Medicine

## 2021-11-19 ENCOUNTER — Other Ambulatory Visit: Payer: Medicaid Other

## 2021-11-19 VITALS — BP 101/63 | HR 87 | Wt 218.2 lb

## 2021-11-19 DIAGNOSIS — O43192 Other malformation of placenta, second trimester: Secondary | ICD-10-CM

## 2021-11-19 DIAGNOSIS — Z3400 Encounter for supervision of normal first pregnancy, unspecified trimester: Secondary | ICD-10-CM

## 2021-11-19 DIAGNOSIS — O99210 Obesity complicating pregnancy, unspecified trimester: Secondary | ICD-10-CM

## 2021-11-19 DIAGNOSIS — Z148 Genetic carrier of other disease: Secondary | ICD-10-CM

## 2021-11-19 DIAGNOSIS — D563 Thalassemia minor: Secondary | ICD-10-CM

## 2021-11-19 NOTE — Progress Notes (Signed)
   Subjective:  Diana Mcintyre is a 30 y.o. G1P0000 at [redacted]w[redacted]d being seen today for ongoing prenatal care.  She is currently monitored for the following issues for this low-risk pregnancy and has Alpha thalassemia minor; Carrier of spinal muscular atrophy; Supervision of normal first pregnancy, antepartum; Marginal insertion of umbilical cord affecting management of mother in second trimester; and Obesity in pregnancy, antepartum on their problem list.  Patient reports no complaints.  Contractions: Not present. Vag. Bleeding: None.  Movement: Present. Denies leaking of fluid.   The following portions of the patient's history were reviewed and updated as appropriate: allergies, current medications, past family history, past medical history, past social history, past surgical history and problem list. Problem list updated.  Objective:   Vitals:   11/19/21 0835  BP: 101/63  Pulse: 87  Weight: 218 lb 3.2 oz (99 kg)    Fetal Status: Fetal Heart Rate (bpm): 156   Movement: Present     General:  Alert, oriented and cooperative. Patient is in no acute distress.  Skin: Skin is warm and dry. No rash noted.   Cardiovascular: Normal heart rate noted  Respiratory: Normal respiratory effort, no problems with respiration noted  Abdomen: Soft, gravid, appropriate for gestational age. Pain/Pressure: Absent     Pelvic: Vag. Bleeding: None     Cervical exam deferred        Extremities: Normal range of motion.  Edema: None  Mental Status: Normal mood and affect. Normal behavior. Normal judgment and thought content.   Urinalysis:      Assessment and Plan:  Pregnancy: G1P0000 at [redacted]w[redacted]d  1. Supervision of normal first pregnancy, antepartum BP and FHR normal 28wk labs today Undecided on TDaP, will let us know at next visit - Glucose Tolerance, 2 Hours w/1 Hour - CBC - RPR - Antibody screen; Future - HIV  2. Marginal insertion of umbilical cord affecting management of mother in second  trimester Following w MFM EFW normal to date  3. Obesity in pregnancy, antepartum   4. Carrier of spinal muscular atrophy   5. Alpha thalassemia minor   Preterm labor symptoms and general obstetric precautions including but not limited to vaginal bleeding, contractions, leaking of fluid and fetal movement were reviewed in detail with the patient. Please refer to After Visit Summary for other counseling recommendations.  Return in 2 weeks (on 12/03/2021) for Dyad patient, ob visit.   Clarnce Flock, MD

## 2021-11-20 LAB — CBC
Hematocrit: 36 % (ref 34.0–46.6)
Hemoglobin: 11.6 g/dL (ref 11.1–15.9)
MCH: 25.6 pg — ABNORMAL LOW (ref 26.6–33.0)
MCHC: 32.2 g/dL (ref 31.5–35.7)
MCV: 80 fL (ref 79–97)
Platelets: 195 10*3/uL (ref 150–450)
RBC: 4.53 x10E6/uL (ref 3.77–5.28)
RDW: 13.3 % (ref 11.7–15.4)
WBC: 7.4 10*3/uL (ref 3.4–10.8)

## 2021-11-20 LAB — GLUCOSE TOLERANCE, 2 HOURS W/ 1HR
Glucose, 1 hour: 140 mg/dL (ref 70–179)
Glucose, 2 hour: 94 mg/dL (ref 70–152)
Glucose, Fasting: 86 mg/dL (ref 70–91)

## 2021-11-20 LAB — SYPHILIS: RPR W/REFLEX TO RPR TITER AND TREPONEMAL ANTIBODIES, TRADITIONAL SCREENING AND DIAGNOSIS ALGORITHM: RPR Ser Ql: NONREACTIVE

## 2021-12-03 ENCOUNTER — Ambulatory Visit: Payer: Medicaid Other

## 2021-12-06 ENCOUNTER — Ambulatory Visit (INDEPENDENT_AMBULATORY_CARE_PROVIDER_SITE_OTHER): Payer: Medicaid Other | Admitting: Family Medicine

## 2021-12-06 ENCOUNTER — Other Ambulatory Visit: Payer: Self-pay

## 2021-12-06 ENCOUNTER — Encounter: Payer: Self-pay | Admitting: Family Medicine

## 2021-12-06 VITALS — BP 114/74 | HR 87 | Wt 222.0 lb

## 2021-12-06 DIAGNOSIS — O99210 Obesity complicating pregnancy, unspecified trimester: Secondary | ICD-10-CM

## 2021-12-06 DIAGNOSIS — Z3400 Encounter for supervision of normal first pregnancy, unspecified trimester: Secondary | ICD-10-CM

## 2021-12-06 DIAGNOSIS — O43192 Other malformation of placenta, second trimester: Secondary | ICD-10-CM

## 2021-12-06 DIAGNOSIS — D563 Thalassemia minor: Secondary | ICD-10-CM

## 2021-12-06 DIAGNOSIS — Z148 Genetic carrier of other disease: Secondary | ICD-10-CM

## 2021-12-06 NOTE — Patient Instructions (Addendum)
We recommend childbirth education to help your cope and plan for labor.   [place] has free or nearly free classes that you are able to sign up online. They have a combination of virtual and in person classes. Additionally they offer breastfeeding specific classes, infant CPR classes and classes for partners and grandparents. The classes are held on the [place] and [place] campuses.   Childbirth Education Options:  [facility] Department Classes:  Childbirth education classes can help you get ready for a positive parenting experience. You can also meet other expectant parents and get free stuff for your baby. Each class runs for five weeks on the same night and costs $45 for the mother-to-be and her support person. Medicaid covers the cost if you are eligible. Call [phone] to register.  [place] Childbirth Education: Classes can vary in availability and schedule is subject to change. For most up-to-date information please visit [email or web address] to review and register.  [employer]   There are also many childbirth education books. The book entitled "The Birth Partner"  by Jabier Mutton is a text that is used to train doulas (trained birth support persons) and is a good Theatre manager for all families.

## 2021-12-06 NOTE — Progress Notes (Signed)
° °  PRENATAL VISIT NOTE  Subjective:  Diana Mcintyre is a 30 y.o. G1P0000 at [redacted]w[redacted]d being seen today for ongoing prenatal care.  She is currently monitored for the following issues for this low-risk pregnancy and has Alpha thalassemia minor; Carrier of spinal muscular atrophy; Supervision of normal first pregnancy, antepartum; Marginal insertion of umbilical cord affecting management of mother in second trimester; and Obesity in pregnancy, antepartum on their problem list.  Patient reports no complaints.  Contractions: Not present. Vag. Bleeding: None.  Movement: Present. Denies leaking of fluid.   The following portions of the patient's history were reviewed and updated as appropriate: allergies, current medications, past family history, past medical history, past social history, past surgical history and problem list.   Objective:   Vitals:   12/06/21 0835  BP: 114/74  Pulse: 87  Weight: 222 lb (100.7 kg)    Fetal Status: Fetal Heart Rate (bpm): 145   Movement: Present     General:  Alert, oriented and cooperative. Patient is in no acute distress.  Skin: Skin is warm and dry. No rash noted.   Cardiovascular: Normal heart rate noted  Respiratory: Normal respiratory effort, no problems with respiration noted  Abdomen: Soft, gravid, appropriate for gestational age.  Pain/Pressure: Absent     Pelvic: Cervical exam deferred        Extremities: Normal range of motion.  Edema: None  Mental Status: Normal mood and affect. Normal behavior. Normal judgment and thought content.   Assessment and Plan:  Pregnancy: G1P0000 at [redacted]w[redacted]d 1. Supervision of normal first pregnancy, antepartum Up to date Having some swelling in hands. BP is WNL. Counseled to monitor sx  2. Obesity in pregnancy, antepartum TWG=53 lb (24 kg) which is well above goal for weight  3. Marginal insertion of umbilical cord affecting management of mother in second trimester FH is appropriate Has [place] scheduled  4. Carrier of  spinal muscular atrophy  5. Alpha thalassemia minor   Preterm labor symptoms and general obstetric precautions including but not limited to vaginal bleeding, contractions, leaking of fluid and fetal movement were reviewed in detail with the patient. Please refer to After Visit Summary for other counseling recommendations.   Return in about 2 weeks (around 12/20/2021) for Mom+Baby Combined Care, Routine prenatal care.  Future Appointments  Date Time Provider [place]  12/10/2021  2:30 PM [facility] NURSE [facility]  12/10/2021  2:45 PM [facility] US4 [facility]  12/21/2021  8:15 AM [facility]  01/11/2022  8:35 AM [facility]  01/18/2022  8:35 AM [facility]AD [facility]  01/25/2022  8:15 AM [facility]AD [facility] [place]  02/02/2022  8:15 AM [facility]ABY[facility]AD [facility] [place]    Caren Macadam, MD

## 2021-12-10 ENCOUNTER — Ambulatory Visit: Payer: Medicaid Other | Attending: Maternal & Fetal Medicine | Admitting: *Deleted

## 2021-12-10 ENCOUNTER — Encounter: Payer: Self-pay | Admitting: *Deleted

## 2021-12-10 ENCOUNTER — Other Ambulatory Visit: Payer: Self-pay | Admitting: *Deleted

## 2021-12-10 ENCOUNTER — Ambulatory Visit (HOSPITAL_BASED_OUTPATIENT_CLINIC_OR_DEPARTMENT_OTHER): Payer: Medicaid Other

## 2021-12-10 ENCOUNTER — Other Ambulatory Visit: Payer: Self-pay

## 2021-12-10 VITALS — BP 127/67 | HR 62

## 2021-12-10 DIAGNOSIS — R638 Other symptoms and signs concerning food and fluid intake: Secondary | ICD-10-CM

## 2021-12-10 DIAGNOSIS — O43199 Other malformation of placenta, unspecified trimester: Secondary | ICD-10-CM

## 2021-12-10 DIAGNOSIS — O43193 Other malformation of placenta, third trimester: Secondary | ICD-10-CM | POA: Insufficient documentation

## 2021-12-10 DIAGNOSIS — Z148 Genetic carrier of other disease: Secondary | ICD-10-CM | POA: Insufficient documentation

## 2021-12-10 DIAGNOSIS — Z3400 Encounter for supervision of normal first pregnancy, unspecified trimester: Secondary | ICD-10-CM

## 2021-12-10 DIAGNOSIS — O3413 Maternal care for benign tumor of corpus uteri, third trimester: Secondary | ICD-10-CM | POA: Insufficient documentation

## 2021-12-10 DIAGNOSIS — Z3A32 32 weeks gestation of pregnancy: Secondary | ICD-10-CM | POA: Insufficient documentation

## 2021-12-10 DIAGNOSIS — D259 Leiomyoma of uterus, unspecified: Secondary | ICD-10-CM | POA: Diagnosis not present

## 2021-12-10 DIAGNOSIS — O99210 Obesity complicating pregnancy, unspecified trimester: Secondary | ICD-10-CM

## 2021-12-10 DIAGNOSIS — Z362 Encounter for other antenatal screening follow-up: Secondary | ICD-10-CM | POA: Insufficient documentation

## 2021-12-19 NOTE — L&D Delivery Note (Signed)
Delivery Note I arrived at the room while mom was pushing effectively. I pushed with the patient and infant came to a full crown and mother paused to wait for next contraction. FHR went to high 70s and rebounded to 120s. At the next contraction, [place] delivered head, shoulders and body in one push. There was a shawl cord. Mother cut the cord with Mother Lawerance Bach taking photos.   At 8:52 AM a viable female was delivered via Vaginal, Spontaneous (Presentation: Right Occiput Anterior).  APGAR: 8, 9; weight - pending .   Placenta status: Spontaneous, Intact.  Cord: 3 vessels with the following complications: None.  Cord pH: not collected  Anesthesia: Epidural Episiotomy: None Lacerations: 1st degree Suture Repair: 3.0 vicryl Est. Blood Loss (mL):  260mL  Mom to postpartum.  Baby to Couplet care / Skin to Skin.  Juanita Craver [facility] 01/21/2022, 9:21 AM

## 2021-12-21 ENCOUNTER — Ambulatory Visit (INDEPENDENT_AMBULATORY_CARE_PROVIDER_SITE_OTHER): Payer: Medicaid Other | Admitting: Family Medicine

## 2021-12-21 ENCOUNTER — Other Ambulatory Visit: Payer: Self-pay

## 2021-12-21 VITALS — BP 116/80 | HR 80 | Wt 227.2 lb

## 2021-12-21 DIAGNOSIS — O99210 Obesity complicating pregnancy, unspecified trimester: Secondary | ICD-10-CM

## 2021-12-21 DIAGNOSIS — O43192 Other malformation of placenta, second trimester: Secondary | ICD-10-CM

## 2021-12-21 DIAGNOSIS — Z3400 Encounter for supervision of normal first pregnancy, unspecified trimester: Secondary | ICD-10-CM

## 2021-12-21 NOTE — Progress Notes (Signed)
° °  Subjective:  Diana Mcintyre is a 31 y.o. G1P0000 at [redacted]w[redacted]d being seen today for ongoing prenatal care.  She is currently monitored for the following issues for this low-risk pregnancy and has Alpha thalassemia minor; Carrier of spinal muscular atrophy; Supervision of normal first pregnancy, antepartum; Marginal insertion of umbilical cord affecting management of mother in second trimester; and Obesity in pregnancy, antepartum on their problem list.  Patient reports no complaints.  Contractions: Not present. Vag. Bleeding: None.  Movement: Present. Denies leaking of fluid.   The following portions of the patient's history were reviewed and updated as appropriate: allergies, current medications, past family history, past medical history, past social history, past surgical history and problem list. Problem list updated.  Objective:   Vitals:   12/21/21 0820  BP: 116/80  Pulse: 80  Weight: 227 lb 3.2 oz (103.1 kg)    Fetal Status: Fetal Heart Rate (bpm): 160   Movement: Present     General:  Alert, oriented and cooperative. Patient is in no acute distress.  Skin: Skin is warm and dry. No rash noted.   Cardiovascular: Normal heart rate noted  Respiratory: Normal respiratory effort, no problems with respiration noted  Abdomen: Soft, gravid, appropriate for gestational age. Pain/Pressure: Absent     Pelvic: Vag. Bleeding: None     Cervical exam deferred        Extremities: Normal range of motion.  Edema: None  Mental Status: Normal mood and affect. Normal behavior. Normal judgment and thought content.   Urinalysis:      Assessment and Plan:  Pregnancy: G1P0000 at [redacted]w[redacted]d  1. Supervision of normal first pregnancy, antepartum BP and FHR normal Discussed labor, she still desires to have a low intervention birth, unsure about IV even saline locked We discussed risks and benefits as well as importance of being flexible with the unpredictable nature of L&D  2. Obesity in pregnancy,  antepartum   3. Marginal insertion of umbilical cord affecting management of mother in second trimester Following w MFM for growth scans Last [place] 12/10/21, EFW 1875g 33% Follow up scheduled for later this month  Preterm labor symptoms and general obstetric precautions including but not limited to vaginal bleeding, contractions, leaking of fluid and fetal movement were reviewed in detail with the patient. Please refer to After Visit Summary for other counseling recommendations.  Return in 2 weeks (on 01/04/2022) for Dyad patient, ob visit.   Clarnce Flock, MD

## 2021-12-30 ENCOUNTER — Ambulatory Visit (INDEPENDENT_AMBULATORY_CARE_PROVIDER_SITE_OTHER): Payer: Medicaid Other

## 2021-12-30 ENCOUNTER — Other Ambulatory Visit: Payer: Self-pay

## 2021-12-30 ENCOUNTER — Encounter: Payer: Self-pay | Admitting: Family Medicine

## 2021-12-30 VITALS — BP 126/85 | HR 72 | Wt 227.0 lb

## 2021-12-30 DIAGNOSIS — Z3400 Encounter for supervision of normal first pregnancy, unspecified trimester: Secondary | ICD-10-CM

## 2021-12-30 DIAGNOSIS — O99210 Obesity complicating pregnancy, unspecified trimester: Secondary | ICD-10-CM

## 2021-12-30 NOTE — Progress Notes (Signed)
Pt here today with c/o swelling in feet and hands with mild headache x 5-6 days. Pt denies any visual changes or vaginal bleeding or abd pain at this time.   [name] BP today is 126/85 Pt has not taken Tylenol for headache. Has been eating and drinking normally.   Pt also states having decreased fetal movement since Sat. Pt states has been having movements but not as often or frequent as they were. Pt placed on monitor for NST.   Colletta Maryland, RN   Reactive NST reviewed by Dr Ilda Basset. Pt ok for discharge. Keep next OB appt on 01/07/22. Pt advised fetal kick counts. Pt verbalized understanding and agreeable to plan of care.  Diana Mcintyre

## 2022-01-07 ENCOUNTER — Ambulatory Visit (INDEPENDENT_AMBULATORY_CARE_PROVIDER_SITE_OTHER): Payer: Medicaid Other | Admitting: Family Medicine

## 2022-01-07 ENCOUNTER — Encounter: Payer: Self-pay | Admitting: *Deleted

## 2022-01-07 ENCOUNTER — Other Ambulatory Visit (HOSPITAL_COMMUNITY)
Admission: RE | Admit: 2022-01-07 | Discharge: 2022-01-07 | Disposition: A | Discharge status: Home / Self Care | Payer: Medicaid Other | Source: Ambulatory Visit | Attending: Family Medicine | Admitting: Family Medicine

## 2022-01-07 ENCOUNTER — Ambulatory Visit: Payer: Medicaid Other | Admitting: *Deleted

## 2022-01-07 ENCOUNTER — Ambulatory Visit: Payer: Medicaid Other | Attending: Obstetrics and Gynecology

## 2022-01-07 ENCOUNTER — Other Ambulatory Visit: Payer: Self-pay

## 2022-01-07 VITALS — BP 117/80 | HR 103

## 2022-01-07 VITALS — BP 137/85 | HR 98 | Wt 236.7 lb

## 2022-01-07 DIAGNOSIS — Z3A36 36 weeks gestation of pregnancy: Secondary | ICD-10-CM | POA: Insufficient documentation

## 2022-01-07 DIAGNOSIS — D563 Thalassemia minor: Secondary | ICD-10-CM | POA: Diagnosis not present

## 2022-01-07 DIAGNOSIS — O43123 Velamentous insertion of umbilical cord, third trimester: Secondary | ICD-10-CM | POA: Insufficient documentation

## 2022-01-07 DIAGNOSIS — D259 Leiomyoma of uterus, unspecified: Secondary | ICD-10-CM | POA: Diagnosis not present

## 2022-01-07 DIAGNOSIS — O43192 Other malformation of placenta, second trimester: Secondary | ICD-10-CM

## 2022-01-07 DIAGNOSIS — O99213 Obesity complicating pregnancy, third trimester: Secondary | ICD-10-CM

## 2022-01-07 DIAGNOSIS — O99210 Obesity complicating pregnancy, unspecified trimester: Secondary | ICD-10-CM

## 2022-01-07 DIAGNOSIS — Z3400 Encounter for supervision of normal first pregnancy, unspecified trimester: Secondary | ICD-10-CM

## 2022-01-07 DIAGNOSIS — Z148 Genetic carrier of other disease: Secondary | ICD-10-CM

## 2022-01-07 DIAGNOSIS — O43193 Other malformation of placenta, third trimester: Secondary | ICD-10-CM

## 2022-01-07 DIAGNOSIS — O3413 Maternal care for benign tumor of corpus uteri, third trimester: Secondary | ICD-10-CM | POA: Insufficient documentation

## 2022-01-07 LAB — OB RESULTS CONSOLE GC/CHLAMYDIA: Gonorrhea: NEGATIVE

## 2022-01-07 NOTE — Patient Instructions (Signed)

## 2022-01-07 NOTE — Progress Notes (Signed)
° ° °  Subjective:  Diana Mcintyre is a 31 y.o. G1P0000 at [redacted]w[redacted]d being seen today for ongoing prenatal care.  She is currently monitored for the following issues for this high-risk pregnancy and has Alpha thalassemia minor; Carrier of spinal muscular atrophy; Supervision of normal first pregnancy, antepartum; Marginal insertion of umbilical cord affecting management of mother in second trimester; and Obesity in pregnancy, antepartum on their problem list.  Patient reports no complaints.  Contractions: Not present. Vag. Bleeding: None.  Movement: Present. Denies leaking of fluid.   The following portions of the patient's history were reviewed and updated as appropriate: allergies, current medications, past family history, past medical history, past social history, past surgical history and problem list. Problem list updated.  Objective:   Vitals:   01/07/22 1007  BP: 137/85  Pulse: 98  Weight: 236 lb 11.2 oz (107.4 kg)    Fetal Status:     Movement: Present  Presentation: Vertex  General:  Alert, oriented and cooperative. Patient is in no acute distress.  Skin: Skin is warm and dry. No rash noted.   Cardiovascular: Normal heart rate noted  Respiratory: Normal respiratory effort, no problems with respiration noted  Abdomen: Soft, gravid, appropriate for gestational age. Pain/Pressure: Absent     Pelvic: Vag. Bleeding: None     Cervical exam performed Dilation: Closed Effacement (%): Thick Station: -3  Extremities: Normal range of motion.  Edema: Trace  Mental Status: Normal mood and affect. Normal behavior. Normal judgment and thought content.   Urinalysis:      Assessment and Plan:  Pregnancy: G1P0000 at [redacted]w[redacted]d  1. Supervision of normal first pregnancy, antepartum BP and FHR normal Swabs today - GC/Chlamydia probe amp ([place])not at [facility] - Culture, beta strep (group b only)  2. Obesity in pregnancy, antepartum   3. Marginal insertion of umbilical cord affecting management  of mother in second trimester Last growth [place] earlier today, EFW 15% No further scans indicated  Preterm labor symptoms and general obstetric precautions including but not limited to vaginal bleeding, contractions, leaking of fluid and fetal movement were reviewed in detail with the patient. Please refer to After Visit Summary for other counseling recommendations.  Return in 1 week (on 01/14/2022) for Dyad patient, ob visit.   Clarnce Flock, MD

## 2022-01-08 ENCOUNTER — Encounter: Payer: Self-pay | Admitting: Radiology

## 2022-01-10 LAB — GC/CHLAMYDIA PROBE AMP (~~LOC~~) NOT AT ARMC
Chlamydia: NEGATIVE
Comment: NEGATIVE
Comment: NORMAL
Neisseria Gonorrhea: NEGATIVE

## 2022-01-11 LAB — CULTURE, BETA STREP (GROUP B ONLY): Strep Gp B Culture: NEGATIVE

## 2022-01-18 ENCOUNTER — Ambulatory Visit (INDEPENDENT_AMBULATORY_CARE_PROVIDER_SITE_OTHER): Payer: Medicaid Other | Admitting: Family Medicine

## 2022-01-18 ENCOUNTER — Other Ambulatory Visit: Payer: Self-pay

## 2022-01-18 VITALS — BP 136/93 | HR 66 | Wt 233.1 lb

## 2022-01-18 DIAGNOSIS — R030 Elevated blood-pressure reading, without diagnosis of hypertension: Secondary | ICD-10-CM | POA: Diagnosis not present

## 2022-01-18 DIAGNOSIS — O99210 Obesity complicating pregnancy, unspecified trimester: Secondary | ICD-10-CM

## 2022-01-18 DIAGNOSIS — Z8759 Personal history of other complications of pregnancy, childbirth and the puerperium: Secondary | ICD-10-CM | POA: Insufficient documentation

## 2022-01-18 DIAGNOSIS — Z3400 Encounter for supervision of normal first pregnancy, unspecified trimester: Secondary | ICD-10-CM

## 2022-01-18 DIAGNOSIS — H938X1 Other specified disorders of right ear: Secondary | ICD-10-CM

## 2022-01-18 DIAGNOSIS — O1403 Mild to moderate pre-eclampsia, third trimester: Secondary | ICD-10-CM | POA: Insufficient documentation

## 2022-01-18 MED ORDER — FLUTICASONE PROPIONATE 50 MCG/ACT NA SUSP: 1.0000 | Freq: Every day | NASAL | 2 refills | Status: AC

## 2022-01-18 NOTE — Progress Notes (Signed)
° °  Subjective:  Diana Mcintyre is a 31 y.o. G1P0000 at [redacted]w[redacted]d being seen today for ongoing prenatal care.  She is currently monitored for the following issues for this low-risk pregnancy and has Alpha thalassemia minor; Carrier of spinal muscular atrophy; Supervision of normal first pregnancy, antepartum; Marginal insertion of umbilical cord affecting management of mother in second trimester; Obesity in pregnancy, antepartum; and Elevated blood pressure reading without diagnosis of hypertension on their problem list.  Patient reports no complaints.  Contractions: Not present. Vag. Bleeding: None.  Movement: Present. Denies leaking of fluid.   The following portions of the patient's history were reviewed and updated as appropriate: allergies, current medications, past family history, past medical history, past social history, past surgical history and problem list. Problem list updated.  Objective:   Vitals:   01/18/22 0836  BP: (!) 136/93  Pulse: 66  Weight: 233 lb 1.6 oz (105.7 kg)    Fetal Status: Fetal Heart Rate (bpm): 142   Movement: Present     General:  Alert, oriented and cooperative. Patient is in no acute distress.  Skin: Skin is warm and dry. No rash noted.   Cardiovascular: Normal heart rate noted  Respiratory: Normal respiratory effort, no problems with respiration noted  Abdomen: Soft, gravid, appropriate for gestational age. Pain/Pressure: Absent     Pelvic: Vag. Bleeding: None     Cervical exam deferred        Extremities: Normal range of motion.  Edema: Trace  Mental Status: Normal mood and affect. Normal behavior. Normal judgment and thought content.   Urinalysis:      Assessment and Plan:  Pregnancy: G1P0000 at [redacted]w[redacted]d  1. Supervision of normal first pregnancy, antepartum BP elevated, see below FHR normal Strongly desires 39wk IOL Cervix fingertip, discussed outpatient foley bulb at next week's visit in the afternoon, she is amenable  2. Obesity in pregnancy,  antepartum   3. Elevated blood pressure reading without diagnosis of hypertension Elevated on repeat, asymptomatic, labs ordered - CBC - Comp Met (CMET) - Protein / creatinine ratio, urine  4. Congestion of right ear Trial flonase - fluticasone (FLONASE) 50 MCG/ACT nasal spray; Place 1 spray into both nostrils daily.  Dispense: 18.2 mL; Refill: 2  Term labor symptoms and general obstetric precautions including but not limited to vaginal bleeding, contractions, leaking of fluid and fetal movement were reviewed in detail with the patient. Please refer to After Visit Summary for other counseling recommendations.  Return in 1 week (on 01/25/2022).   Clarnce Flock, MD

## 2022-01-18 NOTE — Patient Instructions (Signed)

## 2022-01-19 ENCOUNTER — Other Ambulatory Visit: Payer: Self-pay | Admitting: Advanced Practice Midwife

## 2022-01-19 ENCOUNTER — Encounter (HOSPITAL_COMMUNITY): Payer: Self-pay

## 2022-01-19 ENCOUNTER — Telehealth (HOSPITAL_COMMUNITY): Payer: Self-pay | Admitting: *Deleted

## 2022-01-19 ENCOUNTER — Encounter (HOSPITAL_COMMUNITY): Payer: Self-pay | Admitting: *Deleted

## 2022-01-19 ENCOUNTER — Telehealth: Payer: Self-pay

## 2022-01-19 LAB — CBC
Hematocrit: 35.9 % (ref 34.0–46.6)
Hemoglobin: 11.9 g/dL (ref 11.1–15.9)
MCH: 25.6 pg — ABNORMAL LOW (ref 26.6–33.0)
MCHC: 33.1 g/dL (ref 31.5–35.7)
MCV: 77 fL — ABNORMAL LOW (ref 79–97)
Platelets: 124 10*3/uL — ABNORMAL LOW (ref 150–450)
RBC: 4.65 x10E6/uL (ref 3.77–5.28)
RDW: 15.9 % — ABNORMAL HIGH (ref 11.7–15.4)
WBC: 4.7 10*3/uL (ref 3.4–10.8)

## 2022-01-19 LAB — COMPREHENSIVE METABOLIC PANEL
ALT: 20 IU/L (ref 0–32)
AST: 26 IU/L (ref 0–40)
Albumin/Globulin Ratio: 1.5 (ref 1.2–2.2)
Albumin: 3.7 g/dL — ABNORMAL LOW (ref 3.9–5.0)
Alkaline Phosphatase: 121 IU/L (ref 44–121)
BUN/Creatinine Ratio: 8 — ABNORMAL LOW (ref 9–23)
BUN: 7 mg/dL (ref 6–20)
Bilirubin Total: 0.3 mg/dL (ref 0.0–1.2)
CO2: 19 mmol/L — ABNORMAL LOW (ref 20–29)
Calcium: 8.7 mg/dL (ref 8.7–10.2)
Chloride: 105 mmol/L (ref 96–106)
Creatinine, Ser: 0.85 mg/dL (ref 0.57–1.00)
Globulin, Total: 2.5 g/dL (ref 1.5–4.5)
Glucose: 73 mg/dL (ref 70–99)
Potassium: 4.1 mmol/L (ref 3.5–5.2)
Sodium: 139 mmol/L (ref 134–144)
Total Protein: 6.2 g/dL (ref 6.0–8.5)
eGFR: 95 mL/min/{1.73_m2} (ref >59)

## 2022-01-19 LAB — PROTEIN / CREATININE RATIO, URINE
Creatinine, Urine: 211.1 mg/dL
Protein, Ur: 200.6 mg/dL
Protein/Creat Ratio: 950 mg/g creat — ABNORMAL HIGH (ref 0–200)

## 2022-01-19 NOTE — Telephone Encounter (Signed)
Call placed to pt. Spoke with pt. Pt given results and recommendations per Dr Dione Plover. Pt verbalized understanding. Pt scheduled for BP check with nurse on 2/2 at 850am. Pt agreeable to date and time of appt.   Colletta Maryland, RN

## 2022-01-19 NOTE — Telephone Encounter (Signed)
-----   Message from Clarnce Flock, MD sent at 01/19/2022  8:38 AM EST ----- Platelets have dropped though not <100k, and Cr has risen from 0.57>0.85, UPCR still pending. Given mild range BP yesterday I am concerned about PIH, will call patient to have her return tomorrow for in office BP check. If BP normal will get stat repeat PreE labs, if BP elevated then she should be sent for direct admit and IOL.

## 2022-01-19 NOTE — Telephone Encounter (Signed)
Preadmission screen  

## 2022-01-20 ENCOUNTER — Encounter: Payer: Self-pay | Admitting: Family Medicine

## 2022-01-20 ENCOUNTER — Inpatient Hospital Stay (HOSPITAL_COMMUNITY): Payer: Medicaid Other | Admitting: Anesthesiology

## 2022-01-20 ENCOUNTER — Inpatient Hospital Stay (HOSPITAL_COMMUNITY)
Admission: AD | Admit: 2022-01-20 | Discharge: 2022-01-23 | DRG: 807 | Disposition: A | Discharge status: Home / Self Care | Payer: Medicaid Other | Attending: Family Medicine | Admitting: Family Medicine

## 2022-01-20 ENCOUNTER — Ambulatory Visit (INDEPENDENT_AMBULATORY_CARE_PROVIDER_SITE_OTHER): Payer: Medicaid Other

## 2022-01-20 ENCOUNTER — Encounter (HOSPITAL_COMMUNITY): Payer: Self-pay | Admitting: Family Medicine

## 2022-01-20 ENCOUNTER — Encounter: Payer: Self-pay | Admitting: *Deleted

## 2022-01-20 ENCOUNTER — Inpatient Hospital Stay (HOSPITAL_COMMUNITY): Admission: AD | Admit: 2022-01-20 | Payer: Medicaid Other | Source: Home / Self Care | Admitting: Family Medicine

## 2022-01-20 ENCOUNTER — Other Ambulatory Visit: Payer: Self-pay

## 2022-01-20 VITALS — BP 138/94 | HR 93 | Wt 233.0 lb

## 2022-01-20 DIAGNOSIS — O99210 Obesity complicating pregnancy, unspecified trimester: Secondary | ICD-10-CM

## 2022-01-20 DIAGNOSIS — D563 Thalassemia minor: Secondary | ICD-10-CM | POA: Diagnosis present

## 2022-01-20 DIAGNOSIS — Z8759 Personal history of other complications of pregnancy, childbirth and the puerperium: Secondary | ICD-10-CM | POA: Diagnosis present

## 2022-01-20 DIAGNOSIS — O43192 Other malformation of placenta, second trimester: Secondary | ICD-10-CM | POA: Diagnosis present

## 2022-01-20 DIAGNOSIS — O1403 Mild to moderate pre-eclampsia, third trimester: Secondary | ICD-10-CM

## 2022-01-20 DIAGNOSIS — O1404 Mild to moderate pre-eclampsia, complicating childbirth: Principal | ICD-10-CM | POA: Diagnosis present

## 2022-01-20 DIAGNOSIS — Z148 Genetic carrier of other disease: Secondary | ICD-10-CM

## 2022-01-20 DIAGNOSIS — Z3400 Encounter for supervision of normal first pregnancy, unspecified trimester: Secondary | ICD-10-CM

## 2022-01-20 DIAGNOSIS — Z20822 Contact with and (suspected) exposure to covid-19: Secondary | ICD-10-CM | POA: Diagnosis present

## 2022-01-20 DIAGNOSIS — Z3A38 38 weeks gestation of pregnancy: Secondary | ICD-10-CM | POA: Diagnosis not present

## 2022-01-20 DIAGNOSIS — O43123 Velamentous insertion of umbilical cord, third trimester: Secondary | ICD-10-CM | POA: Diagnosis not present

## 2022-01-20 DIAGNOSIS — O99214 Obesity complicating childbirth: Secondary | ICD-10-CM | POA: Diagnosis not present

## 2022-01-20 LAB — CBC
HCT: 36 % (ref 36.0–46.0)
HCT: 33.3 % — ABNORMAL LOW (ref 36.0–46.0)
Hemoglobin: 11.3 g/dL — ABNORMAL LOW (ref 12.0–15.0)
Hemoglobin: 11 g/dL — ABNORMAL LOW (ref 12.0–15.0)
MCH: 24.8 pg — ABNORMAL LOW (ref 26.0–34.0)
MCH: 25.6 pg — ABNORMAL LOW (ref 26.0–34.0)
MCHC: 31.4 g/dL (ref 30.0–36.0)
MCHC: 33 g/dL (ref 30.0–36.0)
MCV: 79.1 fL — ABNORMAL LOW (ref 80.0–100.0)
MCV: 77.6 fL — ABNORMAL LOW (ref 80.0–100.0)
Platelets: 172 10*3/uL (ref 150–400)
Platelets: 148 10*3/uL — ABNORMAL LOW (ref 150–400)
RBC: 4.55 MIL/uL (ref 3.87–5.11)
RBC: 4.29 MIL/uL (ref 3.87–5.11)
RDW: 16.3 % — ABNORMAL HIGH (ref 11.5–15.5)
RDW: 16.5 % — ABNORMAL HIGH (ref 11.5–15.5)
WBC: 5.4 10*3/uL (ref 4.0–10.5)
WBC: 7.5 10*3/uL (ref 4.0–10.5)
nRBC: 0 % (ref 0.0–0.2)
nRBC: 0 % (ref 0.0–0.2)

## 2022-01-20 LAB — COMPREHENSIVE METABOLIC PANEL
ALT: 23 U/L (ref 0–44)
AST: 32 U/L (ref 15–41)
Albumin: 3 g/dL — ABNORMAL LOW (ref 3.5–5.0)
Alkaline Phosphatase: 91 U/L (ref 38–126)
Anion gap: 12 (ref 5–15)
BUN: 8 mg/dL (ref 6–20)
CO2: 20 mmol/L — ABNORMAL LOW (ref 22–32)
Calcium: 9 mg/dL (ref 8.9–10.3)
Chloride: 104 mmol/L (ref 98–111)
Creatinine, Ser: 0.73 mg/dL (ref 0.44–1.00)
GFR, Estimated: >60 mL/min (ref >60)
Glucose, Bld: 77 mg/dL (ref 70–99)
Potassium: 4 mmol/L (ref 3.5–5.1)
Sodium: 136 mmol/L (ref 135–145)
Total Bilirubin: 0.7 mg/dL (ref 0.3–1.2)
Total Protein: 6.1 g/dL — ABNORMAL LOW (ref 6.5–8.1)

## 2022-01-20 LAB — RESP PANEL BY RT-PCR (FLU A&B, COVID) ARPGX2
Influenza A by PCR: NEGATIVE
Influenza B by PCR: NEGATIVE
SARS Coronavirus 2 by RT PCR: NEGATIVE

## 2022-01-20 LAB — TYPE AND SCREEN
ABO/RH(D): A POS
Antibody Screen: NEGATIVE

## 2022-01-20 LAB — PROTEIN / CREATININE RATIO, URINE
Creatinine, Urine: 118.97 mg/dL
Protein Creatinine Ratio: 1.56 mg/mg{Cre} — ABNORMAL HIGH (ref 0.00–0.15)
Total Protein, Urine: 186 mg/dL

## 2022-01-20 MED ORDER — ACETAMINOPHEN 325 MG PO TABS: 650.0000 mg | ORAL_TABLET | ORAL | Status: DC | PRN

## 2022-01-20 MED ORDER — HYDRALAZINE HCL 20 MG/ML IJ SOLN
10.0000 mg | INTRAMUSCULAR | Status: DC | PRN
  Administered 2022-01-21: 10 mg via INTRAVENOUS
  Filled 2022-01-20: qty 1

## 2022-01-20 MED ORDER — OXYTOCIN BOLUS FROM INFUSION
333.0000 mL | Freq: Once | INTRAVENOUS | Status: AC
  Administered 2022-01-21: 333 mL via INTRAVENOUS

## 2022-01-20 MED ORDER — ONDANSETRON HCL 4 MG/2ML IJ SOLN: 4.0000 mg | Freq: Four times a day (QID) | INTRAMUSCULAR | Status: DC | PRN

## 2022-01-20 MED ORDER — PHENYLEPHRINE 40 MCG/ML (10ML) SYRINGE FOR IV PUSH (FOR BLOOD PRESSURE SUPPORT): 80.0000 ug | PREFILLED_SYRINGE | INTRAVENOUS | Status: DC | PRN

## 2022-01-20 MED ORDER — LACTATED RINGERS IV SOLN: 500.0000 mL | Freq: Once | INTRAVENOUS | Status: DC

## 2022-01-20 MED ORDER — MISOPROSTOL 50MCG HALF TABLET
ORAL_TABLET | ORAL | Status: AC
  Filled 2022-01-20: qty 1

## 2022-01-20 MED ORDER — LABETALOL HCL 5 MG/ML IV SOLN
40.0000 mg | INTRAVENOUS | Status: DC | PRN
  Administered 2022-01-21: 40 mg via INTRAVENOUS
  Filled 2022-01-20: qty 8

## 2022-01-20 MED ORDER — LIDOCAINE HCL (PF) 1 % IJ SOLN: 30.0000 mL | INTRAMUSCULAR | Status: DC | PRN

## 2022-01-20 MED ORDER — EPHEDRINE 5 MG/ML INJ: 10.0000 mg | INTRAVENOUS | Status: DC | PRN

## 2022-01-20 MED ORDER — LIDOCAINE-EPINEPHRINE (PF) 2 %-1:200000 IJ SOLN
INTRAMUSCULAR | Status: DC | PRN
  Administered 2022-01-20: 5 mL via EPIDURAL

## 2022-01-20 MED ORDER — LABETALOL HCL 5 MG/ML IV SOLN
80.0000 mg | INTRAVENOUS | Status: DC | PRN
  Administered 2022-01-21: 80 mg via INTRAVENOUS
  Filled 2022-01-20: qty 16

## 2022-01-20 MED ORDER — TERBUTALINE SULFATE 1 MG/ML IJ SOLN: 0.2500 mg | Freq: Once | INTRAMUSCULAR | Status: DC | PRN

## 2022-01-20 MED ORDER — OXYTOCIN-SODIUM CHLORIDE 30-0.9 UT/500ML-% IV SOLN
1.0000 m[IU]/min | INTRAVENOUS | Status: DC
  Administered 2022-01-20: 2 m[IU]/min via INTRAVENOUS
  Filled 2022-01-20: qty 500

## 2022-01-20 MED ORDER — FENTANYL CITRATE (PF) 100 MCG/2ML IJ SOLN
100.0000 ug | INTRAMUSCULAR | Status: DC | PRN
  Administered 2022-01-20 (×3): 100 ug via INTRAVENOUS
  Filled 2022-01-20 (×3): qty 2

## 2022-01-20 MED ORDER — LACTATED RINGERS IV SOLN: INTRAVENOUS | Status: DC

## 2022-01-20 MED ORDER — MISOPROSTOL 50MCG HALF TABLET
50.0000 ug | ORAL_TABLET | ORAL | Status: DC
  Administered 2022-01-20 (×2): 50 ug via BUCCAL
  Filled 2022-01-20 (×2): qty 1

## 2022-01-20 MED ORDER — DIPHENHYDRAMINE HCL 50 MG/ML IJ SOLN
12.5000 mg | INTRAMUSCULAR | Status: DC | PRN
  Administered 2022-01-21: 12.5 mg via INTRAVENOUS
  Filled 2022-01-20: qty 1

## 2022-01-20 MED ORDER — FENTANYL-BUPIVACAINE-NACL 0.5-0.125-0.9 MG/250ML-% EP SOLN
12.0000 mL/h | EPIDURAL | Status: DC | PRN
  Administered 2022-01-20: 12 mL/h via EPIDURAL
  Filled 2022-01-20: qty 250

## 2022-01-20 MED ORDER — LACTATED RINGERS IV SOLN: 500.0000 mL | INTRAVENOUS | Status: DC | PRN

## 2022-01-20 MED ORDER — OXYTOCIN-SODIUM CHLORIDE 30-0.9 UT/500ML-% IV SOLN: 2.5000 [IU]/h | INTRAVENOUS | Status: DC

## 2022-01-20 MED ORDER — SOD CITRATE-CITRIC ACID 500-334 MG/5ML PO SOLN: 30.0000 mL | ORAL | Status: DC | PRN

## 2022-01-20 MED ORDER — LABETALOL HCL 5 MG/ML IV SOLN
20.0000 mg | INTRAVENOUS | Status: DC | PRN
  Administered 2022-01-21: 20 mg via INTRAVENOUS
  Filled 2022-01-20: qty 4

## 2022-01-20 NOTE — Progress Notes (Signed)
LABOR PROGRESS NOTE  Diana Mcintyre is a 31 y.o. G1P0000 at [redacted]w[redacted]d  admitted for IOL for mild PreE.  Subjective: Contractions significantly more frequent and uncomfortable  Objective: BP (!) 154/87    Pulse 82    Temp 98.3 F (36.8 C) (Oral)    Resp 16    Ht 5\' 4"  (1.626 m)    Wt 106.8 kg    LMP 04/29/2021    BMI 40.42 kg/m  or  Vitals:   01/20/22 1334 01/20/22 1430 01/20/22 1450 01/20/22 1532  BP: (!) 142/89 (!) 166/73 (!) 156/96 (!) 154/87  Pulse: 88 82 87 82  Resp: 16 16    Temp:      TempSrc:      Weight:      Height:         Dilation: 1 Effacement (%): Thick Cervical Position: Posterior Station: -2 Presentation: Vertex Exam by:: dr Dione Plover FHT: baseline rate 130, moderate varibility, +acel, no decel Toco: regular contractions q3-4 min  Labs: Lab Results  Component Value Date   WBC 5.4 01/20/2022   HGB 11.3 (L) 01/20/2022   HCT 36.0 01/20/2022   MCV 79.1 (L) 01/20/2022   PLT 172 01/20/2022    Patient Active Problem List   Diagnosis Date Noted   Mild pre-eclampsia, antepartum, third trimester 01/18/2022   Obesity in pregnancy, antepartum 09/16/2021   Marginal insertion of umbilical cord affecting management of mother in second trimester 09/10/2021   Carrier of spinal muscular atrophy 08/18/2021   Supervision of normal first pregnancy, antepartum 08/18/2021   Alpha thalassemia minor 03/30/2021    Assessment / Plan: 31 y.o. G1P0000 at [redacted]w[redacted]d here for IOL for mild Pre-E.  Labor: FB placed at 1130, still in place. Was given misoprostol at that time as well but currently contracting too much for a second dose. Continue q-hourly checks on foley bulb, check patient once out or if contractions space.  Fetal Wellbeing:  Cat I Pain Control:  IV pain meds to date, open to epidural but hoping to have unmedicated birth GBS: neg Anticipated MOD:  NSVD  PreE: labs reassuring on admit. Asymptomatic. Borderline BP's, will watch closely and start HTN protocol/Mg gtt PRN.    Clarnce Flock, MD/MPH Attending Family Medicine Physician, [facility] for [facility], [facility]   01/20/2022, 3:35 PM

## 2022-01-20 NOTE — Progress Notes (Signed)
Diana Mcintyre is a 31 y.o. G1P0000 at [redacted]w[redacted]d  admitted for induction of labor due to preE (mild).  Called to room as patient having intermittent variables that have not resolved despite changing position to both sides.  Subjective: Patient reports she feels well after epidural placed.  Objective: BP 137/82    Pulse 88    Temp 98.5 F (36.9 C) (Oral)    Resp 16    Ht 5\' 4"  (1.626 m)    Wt 106.8 kg    LMP 04/29/2021    SpO2 100%    BMI 40.42 kg/m  No intake/output data recorded. No intake/output data recorded.  FHT:  FHR: 150 bpm, variability: moderate,  accelerations:  Present,  decelerations:  Present variable decelerations UC:   regular, every 2-3 minutes SVE:   Dilation: 4 Effacement (%): 50 Station: -2 Exam by:: Dr. Cy Blamer  Labs: Lab Results  Component Value Date   WBC 7.5 01/20/2022   HGB 11.0 (L) 01/20/2022   HCT 33.3 (L) 01/20/2022   MCV 77.6 (L) 01/20/2022   PLT 148 (L) 01/20/2022    Assessment / Plan: G1P0000 at [redacted]w[redacted]d  admitted for induction of labor due to preE (mild).  Labor:  checked cervix as having recurrent variables. Cervix~ 4cm. BBOW during contractions. Head somewhat ballotable. Will continue pitocin. Assess for AROM at next check. Preeclampsia:  no signs or symptoms of toxicity Fetal Wellbeing:  Category II for small stretch of recurrent variables. Resolved with position change to high fowlers.  Pain Control:  Epidural I/D:   GBS neg   Renard Matter 01/20/2022, 11:39 PM

## 2022-01-20 NOTE — Anesthesia Procedure Notes (Signed)
Epidural Patient location during procedure: OB Start time: 01/20/2022 9:40 PM End time: 01/20/2022 9:50 PM  Staffing Anesthesiologist: Freddrick March, MD Performed: anesthesiologist   Preanesthetic Checklist Completed: patient identified, IV checked, risks and benefits discussed, monitors and equipment checked, pre-op evaluation and timeout performed  Epidural Patient position: sitting Prep: DuraPrep and site prepped and draped Patient monitoring: continuous pulse ox, blood pressure, heart rate and cardiac monitor Approach: midline Location: L3-L4 Injection technique: LOR air  Needle:  Needle type: Tuohy  Needle gauge: 17 G Needle length: 9 cm Needle insertion depth: 8 cm Catheter type: closed end flexible Catheter size: 19 Gauge Catheter at skin depth: 14 cm Test dose: negative  Assessment Sensory level: T8 Events: blood not aspirated, injection not painful, no injection resistance, no paresthesia and negative IV test  Additional Notes Patient identified. Risks/Benefits/Options discussed with patient including but not limited to bleeding, infection, nerve damage, paralysis, failed block, incomplete pain control, headache, blood pressure changes, nausea, vomiting, reactions to medication both or allergic, itching and postpartum back pain. Confirmed with bedside nurse the patient's most recent platelet count. Confirmed with patient that they are not currently taking any anticoagulation, have any bleeding history or any family history of bleeding disorders. Patient expressed understanding and wished to proceed. All questions were answered. Sterile technique was used throughout the entire procedure. Please see nursing notes for vital signs. Test dose was given through epidural catheter and negative prior to continuing to dose epidural or start infusion. Warning signs of high block given to the patient including shortness of breath, tingling/numbness in hands, complete motor block, or  any concerning symptoms with instructions to call for help. Patient was given instructions on fall risk and not to get out of bed. All questions and concerns addressed with instructions to call with any issues or inadequate analgesia.  Reason for block:procedure for pain

## 2022-01-20 NOTE — Progress Notes (Signed)
Pt here today for BP check per Dr Dione Plover. Pt had abnormal labs on 01/18/22 and here for repeat BP check.  BP today was 138/94. Pt denies any headaches or visual changes. Only having swelling in ankles since last night.   Reviewed with Dr Dione Plover. Pt advised to go to L&D for admission for IOL. Pt given recommendations in office. Pt agreeable to plan of care.  Shelda Pal

## 2022-01-20 NOTE — Progress Notes (Signed)
Chart reviewed for nurse visit. Agree with plan of care.   Clarnce Flock, MD 01/20/22 2:55 PM

## 2022-01-20 NOTE — Progress Notes (Signed)
Diana Mcintyre is a 31 y.o. G1P0000 at [redacted]w[redacted]d  admitted for induction of labor due to preE without SF.  Subjective: Reports feeling more discomfort with contractions. Last dose of IV Fentanyl was not helpful for her pain. Tearful during contractions.  Objective: BP (!) 143/68    Pulse 72    Temp 98.6 F (37 C) (Oral)    Resp 16    Ht 5\' 4"  (1.626 m)    Wt 106.8 kg    LMP 04/29/2021    BMI 40.42 kg/m  No intake/output data recorded. No intake/output data recorded.  FHT:  FHR: 140 bpm, variability: moderate,  accelerations:  Present,  decelerations:  Absent UC:   regular, every 2-3 minutes SVE:   Dilation: 4 Effacement (%): 60 Station: -2 Exam by:: Dr. Cy Blamer  Labs: Lab Results  Component Value Date   WBC 5.4 01/20/2022   HGB 11.3 (L) 01/20/2022   HCT 36.0 01/20/2022   MCV 79.1 (L) 01/20/2022   PLT 172 01/20/2022    Assessment / Plan: G1P0000 at [redacted]w[redacted]d  admitted for induction of labor due to preE without SF.  Labor:  patient is now painfully contracting. Cervix at 4 cm, 60% effaced. Plan to start pitocin now. Patient also opting for epidural at this time and ANMD has been notified and will be placed once her labs are back. Head continued to be high in pelvis and with bulging bag of water. Discussed positioning (including peanut ball) to facilitate descent in pelvis and will assess for AROM at next check. Preeclampsia:  no signs or symptoms of toxicity. BP mild range. Will continue to monitor. Plt trend down from 162>148 prior to epidural. Two days ago were in 120s. Will recheck labs in AM. Fetal Wellbeing:  Category I Pain Control:  Planning Epidural as soon as labs back I/D:   GBS neg   Renard Matter 01/20/2022, 9:11 PM

## 2022-01-20 NOTE — Anesthesia Preprocedure Evaluation (Addendum)
Anesthesia Evaluation  Patient identified by MRN, date of birth, ID band Patient awake    Reviewed: Allergy & Precautions, NPO status , Patient's Chart, lab work & pertinent test results  Airway Mallampati: III  TM Distance: >3 FB Neck ROM: Full    Dental no notable dental hx.    Pulmonary    Pulmonary exam normal breath sounds clear to auscultation       Cardiovascular hypertension (gHTN), Normal cardiovascular exam Rhythm:Regular Rate:Normal     Neuro/Psych negative neurological ROS  negative psych ROS   GI/Hepatic negative GI ROS, Neg liver ROS,   Endo/Other  Morbid obesity (BMI 40)  Renal/GU negative Renal ROS  negative genitourinary   Musculoskeletal negative musculoskeletal ROS (+)   Abdominal   Peds  Hematology negative hematology ROS (+)   Anesthesia Other Findings   Reproductive/Obstetrics (+) Pregnancy                            Anesthesia Physical Anesthesia Plan  ASA: 3  Anesthesia Plan: Epidural   Post-op Pain Management:    Induction:   PONV Risk Score and Plan: Treatment may vary due to age or medical condition  Airway Management Planned: Natural Airway  Additional Equipment:   Intra-op Plan:   Post-operative Plan:   Informed Consent: I have reviewed the patients History and Physical, chart, labs and discussed the procedure including the risks, benefits and alternatives for the proposed anesthesia with the patient or authorized representative who has indicated his/her understanding and acceptance.       Plan Discussed with: Anesthesiologist  Anesthesia Plan Comments: (Patient identified. Risks, benefits, options discussed with patient including but not limited to bleeding, infection, nerve damage, paralysis, failed block, incomplete pain control, headache, blood pressure changes, nausea, vomiting, reactions to medication, itching, and post partum back pain.  Confirmed with bedside nurse the patient's most recent platelet count. Confirmed with the patient that they are not taking any anticoagulation, have any bleeding history or any family history of bleeding disorders. Patient expressed understanding and wishes to proceed. All questions were answered. )        Anesthesia Quick Evaluation

## 2022-01-20 NOTE — H&P (Signed)
LABOR AND DELIVERY ADMISSION HISTORY AND PHYSICAL NOTE  Sarae Nicholes is a 31 y.o. female G1P0000 with IUP at 30w0dby LMP presenting for mild pre-eclampsia.   Patient seen in clinic two days prior with no symptoms but mild range BP's. Labs obtained showed UPCR of 0.9, platelets of 124, and Cr increasing to 0.8 from baseline of 0.5 a few months prior. Returned to clinic today for repeat BP check and continues to have mild range BP, diagnosed with mild pre-eclampsia and sent to L&D for induction of labor.   She reports positive fetal movement. She denies leakage of fluid, vaginal bleeding, or contractions.   She plans on breast and bottle feeding. Her contraception plan is: none.  Prenatal History/Complications: [facility]  Sono:  '@[redacted]w[redacted]d' , CWD, normal anatomy, cephalic presentation, posterior placenta, 15%ile, [id number] Pregnancy complications:  - marginal cord insertion - mild pre-clampsia  Past Medical History: Past Medical History:  Diagnosis Date   Anemia    Pregnancy induced hypertension     Past Surgical History: Past Surgical History:  Procedure Laterality Date   NO PAST SURGERIES      Obstetrical History: OB History     Gravida  1   Para  0   Term  0   Preterm  0   AB  0   Living  0      SAB  0   IAB  0   Ectopic  0   Multiple  0   Live Births  0           Social History: Social History   Socioeconomic History   Marital status: Single    Spouse name: Not on file   Number of children: Not on file   Years of education: Not on file   Highest education level: Not on file  Occupational History   Not on file  Tobacco Use   Smoking status: Never   Smokeless tobacco: Never  Vaping Use   Vaping Use: Never used  Substance and Sexual Activity   Alcohol use: Not Currently    Alcohol/week: 1.0 standard drink    Types: 1 Glasses of wine per week   Drug use: Never   Sexual activity: Yes    Birth control/protection:  None  Other Topics Concern   Not on file  Social History Narrative   Not on file   Social Determinants of Health   Financial Resource Strain: Not on file  Food Insecurity: No Food Insecurity   Worried About Running Out of Food in the Last Year: Never true   Ran Out of Food in the Last Year: Never true  Transportation Needs: No Transportation Needs   Lack of Transportation (Medical): No   Lack of Transportation (Non-Medical): No  Physical Activity: Not on file  Stress: Not on file  Social Connections: Not on file    Family History: Family History  Adopted: Yes  Problem Relation Age of Onset   Diabetes Paternal Grandfather    Cancer Mother    Diabetes Sister     Allergies: No Known Allergies  Medications Prior to Admission  Medication Sig Dispense Refill Last Dose   Blood Pressure Monitoring (BLOOD PRESSURE KIT) DEVI 1 Device by Does not apply route once a week. 1 each 0    fluticasone (FLONASE) 50 MCG/ACT nasal spray Place 1 spray into both nostrils daily. 18.2 mL 2    Prenatal Vit-Fe Fumarate-FA (MULTIVITAMIN-PRENATAL) 27-0.8 MG TABS tablet Take 1  tablet by mouth daily at 12 noon.        Review of Systems  All systems reviewed and negative except as stated in HPI  Physical Exam Blood pressure 139/90, pulse 85, temperature 98.3 F (36.8 C), temperature source Oral, resp. rate 16, height '5\' 4"'  (1.626 m), weight 106.8 kg, last menstrual period 04/29/2021. General appearance: alert, oriented, NAD Lungs: normal respiratory effort Heart: regular rate Abdomen: soft, non-tender; gravid Extremities: No calf swelling or tenderness Presentation: cephalic by SVE  Fetal monitoringBaseline: 130 bpm, Variability: Good {> 6 bpm), Accelerations: Reactive, and Decelerations: Absent Uterine activity: irregular  Dilation: 1 Effacement (%): Thick Station: -2 Exam by:: dr Dione Plover  Prenatal labs: ABO, Rh: --/--/A POS (02/02 1013) Antibody: NEG (02/02 1013) Rubella: 1.30  (08/31 1500) RPR: Non Reactive (12/02 0835)  HBsAg: Negative (08/31 1500)  HIV: Non Reactive (08/31 1500)  GC/Chlamydia:  Neisseria Gonorrhea  Date Value Ref Range Status  01/07/2022 Negative  Final   Chlamydia  Date Value Ref Range Status  01/07/2022 Negative  Final   GBS: Negative/-- (01/20 1058)  2-hr GTT: normal Genetic screening:  low risk Anatomy US: marginal cord insertion but otherwise normal  Prenatal Transfer Tool  Maternal Diabetes: No Genetic Screening: Normal Maternal Ultrasounds/Referrals: Normal and Other:marginal cord insertion Fetal Ultrasounds or other Referrals:  None Maternal Substance Abuse:  No Significant Maternal Medications:  None Significant Maternal Lab Results: Group B Strep negative  Results for orders placed or performed during the hospital encounter of 01/20/22 (from the past 24 hour(s))  CBC   Collection Time: 01/20/22 10:13 AM  Result Value Ref Range   WBC 5.4 4.0 - 10.5 K/uL   RBC 4.55 3.87 - 5.11 MIL/uL   Hemoglobin 11.3 (L) 12.0 - 15.0 g/dL   HCT 36.0 36.0 - 46.0 %   MCV 79.1 (L) 80.0 - 100.0 fL   MCH 24.8 (L) 26.0 - 34.0 pg   MCHC 31.4 30.0 - 36.0 g/dL   RDW 16.3 (H) 11.5 - 15.5 %   Platelets 172 150 - 400 K/uL   nRBC 0.0 0.0 - 0.2 %  Comprehensive metabolic panel   Collection Time: 01/20/22 10:13 AM  Result Value Ref Range   Sodium 136 135 - 145 mmol/L   Potassium 4.0 3.5 - 5.1 mmol/L   Chloride 104 98 - 111 mmol/L   CO2 20 (L) 22 - 32 mmol/L   Glucose, Bld 77 70 - 99 mg/dL   BUN 8 6 - 20 mg/dL   Creatinine, Ser 0.73 0.44 - 1.00 mg/dL   Calcium 9.0 8.9 - 10.3 mg/dL   Total Protein 6.1 (L) 6.5 - 8.1 g/dL   Albumin 3.0 (L) 3.5 - 5.0 g/dL   AST 32 15 - 41 U/L   ALT 23 0 - 44 U/L   Alkaline Phosphatase 91 38 - 126 U/L   Total Bilirubin 0.7 0.3 - 1.2 mg/dL   GFR, Estimated >60 >60 mL/min   Anion gap 12 5 - 15  Type and screen [place]   Collection Time: 01/20/22 10:13 AM  Result Value Ref Range    ABO/RH(D) A POS    Antibody Screen NEG    Sample Expiration      01/23/2022,2359 Performed at [facility], [address], [place], [place] [address]   Resp Panel by RT-PCR (Flu A&B, Covid) Nasopharyngeal Swab   Collection Time: 01/20/22 10:27 AM   Specimen: Nasopharyngeal Swab; Nasopharyngeal(NP) swabs in vial transport medium  Result Value  Ref Range   SARS Coronavirus 2 by RT PCR NEGATIVE NEGATIVE   Influenza A by PCR NEGATIVE NEGATIVE   Influenza B by PCR NEGATIVE NEGATIVE    Patient Active Problem List   Diagnosis Date Noted   Mild pre-eclampsia, antepartum, third trimester 01/18/2022   Obesity in pregnancy, antepartum 09/16/2021   Marginal insertion of umbilical cord affecting management of mother in second trimester 09/10/2021   Carrier of spinal muscular atrophy 08/18/2021   Supervision of normal first pregnancy, antepartum 08/18/2021   Alpha thalassemia minor 03/30/2021    Assessment: Danise Dehne is a 31 y.o. G1P0000 at 90w0dhere for IOL for mild pre-eclampsia.  #Labor: FB placed with 60cc, given misoprostol. #Pain: IV pain meds PRN, epidural upon request #FWB: Cat I #GBS/ID: neg #COVID: swab pending #MOF: Breast and bottle #MOC: none #Circ: n/a  #Pre-eclampsia with mild features: ongoing mild range BP's, labs are stable to improved compared to check two days prior. Patient is asymptomatic.   MAnnice NeedyEHennep[facility]tr2/01/2022, 12:38 PM

## 2022-01-20 NOTE — Progress Notes (Signed)
LABOR PROGRESS NOTE  Diana Mcintyre is a 31 y.o. G1P0000 at [redacted]w[redacted]d  admitted for IOL for mild PreE.  Subjective: Contractions significantly more frequent and uncomfortable  Objective: BP (!) 155/83    Pulse 72    Temp 98.6 F (37 C) (Oral)    Resp 16    Ht 5\' 4"  (1.626 m)    Wt 106.8 kg    LMP 04/29/2021    BMI 40.42 kg/m  or  Vitals:   01/20/22 1450 01/20/22 1532 01/20/22 1627 01/20/22 1659  BP: (!) 156/96 (!) 154/87 (!) 154/101 (!) 155/83  Pulse: 87 82 84 72  Resp:   16 16  Temp:   99.6 F (37.6 C) 98.6 F (37 C)  TempSrc:   Oral Oral  Weight:      Height:         Dilation: 4 Effacement (%): 60 Cervical Position: Posterior Station: -2 Presentation: Vertex Exam by:: Dozier Berkovich FHT: baseline rate 135, moderate varibility, +acel, no decel Toco: irregular contractions  Labs: Lab Results  Component Value Date   WBC 5.4 01/20/2022   HGB 11.3 (L) 01/20/2022   HCT 36.0 01/20/2022   MCV 79.1 (L) 01/20/2022   PLT 172 01/20/2022    Patient Active Problem List   Diagnosis Date Noted   Mild pre-eclampsia, antepartum, third trimester 01/18/2022   Obesity in pregnancy, antepartum 09/16/2021   Marginal insertion of umbilical cord affecting management of mother in second trimester 09/10/2021   Carrier of spinal muscular atrophy 08/18/2021   Supervision of normal first pregnancy, antepartum 08/18/2021   Alpha thalassemia minor 03/30/2021    Assessment / Plan: 32 y.o. G1P0000 at 107w0d here for IOL for mild Pre-E.  Labor: s/p FB and miso x1, 4cm but still a foley 4. Will give additional dose of cytotec, consider starting pitocin at next check.  Fetal Wellbeing:  Cat I Pain Control:  IV pain meds to date, open to epidural but hoping to have unmedicated birth GBS: neg Anticipated MOD:  NSVD  PreE: labs reassuring on admit. Asymptomatic. Borderline BP's, will watch closely and start HTN protocol/Mg gtt PRN.   Clarnce Flock, MD/MPH Attending Family Medicine Physician,  [facility] for [facility]   01/20/2022, 5:11 PM

## 2022-01-21 ENCOUNTER — Encounter (HOSPITAL_COMMUNITY): Payer: Self-pay | Admitting: Family Medicine

## 2022-01-21 DIAGNOSIS — Z3A38 38 weeks gestation of pregnancy: Secondary | ICD-10-CM

## 2022-01-21 DIAGNOSIS — O1404 Mild to moderate pre-eclampsia, complicating childbirth: Secondary | ICD-10-CM

## 2022-01-21 LAB — CBC
HCT: 33.7 % — ABNORMAL LOW (ref 36.0–46.0)
HCT: 32.2 % — ABNORMAL LOW (ref 36.0–46.0)
Hemoglobin: 10.7 g/dL — ABNORMAL LOW (ref 12.0–15.0)
Hemoglobin: 10.6 g/dL — ABNORMAL LOW (ref 12.0–15.0)
MCH: 24.9 pg — ABNORMAL LOW (ref 26.0–34.0)
MCH: 25.8 pg — ABNORMAL LOW (ref 26.0–34.0)
MCHC: 31.8 g/dL (ref 30.0–36.0)
MCHC: 32.9 g/dL (ref 30.0–36.0)
MCV: 78.6 fL — ABNORMAL LOW (ref 80.0–100.0)
MCV: 78.3 fL — ABNORMAL LOW (ref 80.0–100.0)
Platelets: 145 10*3/uL — ABNORMAL LOW (ref 150–400)
Platelets: 121 10*3/uL — ABNORMAL LOW (ref 150–400)
RBC: 4.29 MIL/uL (ref 3.87–5.11)
RBC: 4.11 MIL/uL (ref 3.87–5.11)
RDW: 16.6 % — ABNORMAL HIGH (ref 11.5–15.5)
RDW: 16.4 % — ABNORMAL HIGH (ref 11.5–15.5)
WBC: 10.4 10*3/uL (ref 4.0–10.5)
WBC: 11.2 10*3/uL — ABNORMAL HIGH (ref 4.0–10.5)
nRBC: 0 % (ref 0.0–0.2)
nRBC: 0 % (ref 0.0–0.2)

## 2022-01-21 LAB — COMPREHENSIVE METABOLIC PANEL
ALT: 22 U/L (ref 0–44)
AST: 29 U/L (ref 15–41)
Albumin: 2.8 g/dL — ABNORMAL LOW (ref 3.5–5.0)
Alkaline Phosphatase: 96 U/L (ref 38–126)
Anion gap: 8 (ref 5–15)
BUN: 8 mg/dL (ref 6–20)
CO2: 20 mmol/L — ABNORMAL LOW (ref 22–32)
Calcium: 8.9 mg/dL (ref 8.9–10.3)
Chloride: 105 mmol/L (ref 98–111)
Creatinine, Ser: 0.97 mg/dL (ref 0.44–1.00)
GFR, Estimated: >60 mL/min (ref >60)
Glucose, Bld: 89 mg/dL (ref 70–99)
Potassium: 4 mmol/L (ref 3.5–5.1)
Sodium: 133 mmol/L — ABNORMAL LOW (ref 135–145)
Total Bilirubin: 0.7 mg/dL (ref 0.3–1.2)
Total Protein: 5.7 g/dL — ABNORMAL LOW (ref 6.5–8.1)

## 2022-01-21 LAB — RPR: RPR Ser Ql: NONREACTIVE

## 2022-01-21 MED ORDER — MAGNESIUM SULFATE 40 GM/1000ML IV SOLN
2.0000 g/h | INTRAVENOUS | Status: AC
  Administered 2022-01-21 – 2022-01-22 (×2): 2 g/h via INTRAVENOUS
  Filled 2022-01-21 (×2): qty 1000

## 2022-01-21 MED ORDER — ACETAMINOPHEN 325 MG PO TABS: 650.0000 mg | ORAL_TABLET | ORAL | Status: DC | PRN

## 2022-01-21 MED ORDER — SIMETHICONE 80 MG PO CHEW: 80.0000 mg | CHEWABLE_TABLET | ORAL | Status: DC | PRN

## 2022-01-21 MED ORDER — PRENATAL MULTIVITAMIN CH
1.0000 | ORAL_TABLET | Freq: Every day | ORAL | Status: DC
  Administered 2022-01-21 – 2022-01-23 (×3): 1 via ORAL
  Filled 2022-01-21 (×3): qty 1

## 2022-01-21 MED ORDER — LABETALOL HCL 5 MG/ML IV SOLN: 20.0000 mg | INTRAVENOUS | Status: DC | PRN

## 2022-01-21 MED ORDER — FUROSEMIDE 40 MG PO TABS
20.0000 mg | ORAL_TABLET | Freq: Two times a day (BID) | ORAL | Status: DC
Start: 2022-01-21 — End: 2022-01-23
  Administered 2022-01-21 – 2022-01-23 (×4): 20 mg via ORAL
  Filled 2022-01-21 (×4): qty 1

## 2022-01-21 MED ORDER — LACTATED RINGERS IV SOLN: INTRAVENOUS | Status: DC

## 2022-01-21 MED ORDER — OXYCODONE HCL 5 MG PO TABS: 10.0000 mg | ORAL_TABLET | ORAL | Status: DC | PRN

## 2022-01-21 MED ORDER — DIPHENHYDRAMINE HCL 25 MG PO CAPS: 25.0000 mg | ORAL_CAPSULE | Freq: Four times a day (QID) | ORAL | Status: DC | PRN

## 2022-01-21 MED ORDER — FUROSEMIDE 40 MG PO TABS: 20.0000 mg | ORAL_TABLET | Freq: Two times a day (BID) | ORAL | Status: DC

## 2022-01-21 MED ORDER — NIFEDIPINE ER OSMOTIC RELEASE 30 MG PO TB24
30.0000 mg | ORAL_TABLET | Freq: Every day | ORAL | Status: DC
  Administered 2022-01-21 – 2022-01-22 (×2): 30 mg via ORAL
  Filled 2022-01-21 (×2): qty 1

## 2022-01-21 MED ORDER — COCONUT OIL OIL
1.0000 "application " | TOPICAL_OIL | Status: DC | PRN
  Administered 2022-01-21: 1 via TOPICAL

## 2022-01-21 MED ORDER — ONDANSETRON HCL 4 MG PO TABS: 4.0000 mg | ORAL_TABLET | ORAL | Status: DC | PRN

## 2022-01-21 MED ORDER — LABETALOL HCL 5 MG/ML IV SOLN: 80.0000 mg | INTRAVENOUS | Status: DC | PRN

## 2022-01-21 MED ORDER — ONDANSETRON HCL 4 MG/2ML IJ SOLN: 4.0000 mg | INTRAMUSCULAR | Status: DC | PRN

## 2022-01-21 MED ORDER — IBUPROFEN 600 MG PO TABS
600.0000 mg | ORAL_TABLET | Freq: Four times a day (QID) | ORAL | Status: DC
  Administered 2022-01-21 – 2022-01-23 (×8): 600 mg via ORAL
  Filled 2022-01-21 (×8): qty 1

## 2022-01-21 MED ORDER — LABETALOL HCL 5 MG/ML IV SOLN: 40.0000 mg | INTRAVENOUS | Status: DC | PRN

## 2022-01-21 MED ORDER — BENZOCAINE-MENTHOL 20-0.5 % EX AERO
1.0000 "application " | INHALATION_SPRAY | CUTANEOUS | Status: DC | PRN
  Administered 2022-01-21: 1 via TOPICAL
  Filled 2022-01-21: qty 56

## 2022-01-21 MED ORDER — OXYCODONE HCL 5 MG PO TABS: 5.0000 mg | ORAL_TABLET | ORAL | Status: DC | PRN

## 2022-01-21 MED ORDER — LACTATED RINGERS AMNIOINFUSION
INTRAVENOUS | Status: DC
  Filled 2022-01-21 (×2): qty 1000

## 2022-01-21 MED ORDER — MAGNESIUM SULFATE BOLUS VIA INFUSION
4.0000 g | Freq: Once | INTRAVENOUS | Status: AC
Start: 2022-01-21 — End: 2022-01-21
  Administered 2022-01-21: 4 g via INTRAVENOUS
  Filled 2022-01-21: qty 1000

## 2022-01-21 MED ORDER — DIBUCAINE (PERIANAL) 1 % EX OINT: 1.0000 "application " | TOPICAL_OINTMENT | CUTANEOUS | Status: DC | PRN

## 2022-01-21 MED ORDER — WITCH HAZEL-GLYCERIN EX PADS: 1.0000 "application " | MEDICATED_PAD | CUTANEOUS | Status: DC | PRN

## 2022-01-21 MED ORDER — DOCUSATE SODIUM 100 MG PO CAPS
100.0000 mg | ORAL_CAPSULE | Freq: Two times a day (BID) | ORAL | Status: DC
  Administered 2022-01-21 – 2022-01-23 (×4): 100 mg via ORAL
  Filled 2022-01-21 (×4): qty 1

## 2022-01-21 MED ORDER — HYDRALAZINE HCL 20 MG/ML IJ SOLN: 10.0000 mg | INTRAMUSCULAR | Status: DC | PRN

## 2022-01-21 NOTE — Anesthesia Postprocedure Evaluation (Signed)
Anesthesia Post Note  Patient: Diana Mcintyre  Procedure(s) Performed: AN AD [place]     Patient location during evaluation: Mother Baby Anesthesia Type: Epidural Level of consciousness: awake and alert Pain management: pain level controlled Vital Signs Assessment: post-procedure vital signs reviewed and stable Respiratory status: spontaneous breathing, nonlabored ventilation and respiratory function stable Cardiovascular status: stable Postop Assessment: no headache, no backache, epidural receding, no apparent nausea or vomiting, patient able to bend at knees, adequate PO intake and able to ambulate Anesthetic complications: no   No notable events documented.  Last Vitals:  Vitals:   01/21/22 1353 01/21/22 1547  BP: (!) 145/86 134/75  Pulse: (!) 111 (!) 102  Resp: 16 16  Temp:  36.7 C  SpO2: 98% 98%    Last Pain:  Vitals:   01/21/22 1547  TempSrc: Oral  PainSc:    Pain Goal: Patients Stated Pain Goal: 2 (01/21/22 1144)                 [name]

## 2022-01-21 NOTE — Lactation Note (Signed)
This note was copied from a baby's chart. Lactation Consultation Note  Patient Name: Diana Mcintyre [id number] Date: 01/21/2022 Reason for consult: L&D Initial assessment;Early term 37-38.6wks;1st time breastfeeding Age:30 hours  P1, Baby cueing upon entering STS. Assisted with latching. Baby latched with ease. Lactation to follow up on MBU.  Maternal Data Does the patient have breastfeeding experience prior to this delivery?: No   LATCH Score Latch: Grasps breast easily, tongue down, lips flanged, rhythmical sucking.  Audible Swallowing: A few with stimulation  Type of Nipple: Everted at rest and after stimulation  Comfort (Breast/Nipple): Soft / non-tender  Hold (Positioning): Assistance needed to correctly position infant at breast and maintain latch.  LATCH Score: 8   Interventions Interventions: Assisted with latch;Skin to skin;Education  Consult Status Consult Status: Follow-up from L&D    Diana Mcintyre [facility] 01/21/2022, 9:36 AM

## 2022-01-21 NOTE — Progress Notes (Signed)
Diana Mcintyre is a 31 y.o. G1P0000 at [redacted]w[redacted]d admitted for induction of labor due to preE (mild).  Subjective: Reports feels like her water might have broken.   Objective: BP 135/87    Pulse 79    Temp 98.5 F (36.9 C) (Oral)    Resp 16    Ht 5\' 4"  (1.626 m)    Wt 106.8 kg    LMP 04/29/2021    SpO2 100%    BMI 40.42 kg/m  No intake/output data recorded. No intake/output data recorded.  FHT:  FHR: 120 bpm, variability: moderate,  accelerations:  Present,  decelerations:  Present intermittent variable decelerations as well as early decels UC:   regular, every 1-2 minutes SVE:   Dilation: 4 Effacement (%): 70 Station: -1 Exam by:: Dr. Cy Blamer  Labs: Lab Results  Component Value Date   WBC 7.5 01/20/2022   HGB 11.0 (L) 01/20/2022   HCT 33.3 (L) 01/20/2022   MCV 77.6 (L) 01/20/2022   PLT 148 (L) 01/20/2022    Assessment / Plan: G1P0000 at [redacted]w[redacted]d admitted for induction of labor due to preE (mild).  Labor:  Patient now [facility] (confirmed with current check), IUPC placed. Pitocin at 2 and having strong contractions every 1-2 min. Will stop pitocin and monitor and allow for resuscitation given some period of repeated variables . If contractions space out  can restart pitocin. Alternatively if variable decels continue can start an amnioinfusion with IUPC that is in place. Preeclampsia:  no signs or symptoms of toxicity Fetal Wellbeing:  Category II (for intermittent variable decels) with moderate variability  Pain Control:  Epidural I/D:   GBS neg   Renard Matter, MD, MPH OB Fellow, Faculty Practice

## 2022-01-21 NOTE — Lactation Note (Addendum)
This note was copied from a baby's chart. Lactation Consultation Note  Patient Name: Diana Mcintyre [id number] Date: 01/21/2022 Reason for consult: Initial assessment;Mother's request;Difficult latch;Primapara;1st time breastfeeding;Early term 37-38.6wks;Breastfeeding assistance;Other (Comment) (PIH on Mg,) Age:30 hours Nifedipine and Lasix  LC assisted with latching infant at the breast, she did a few sucks but not consistently. Infant can extend tongue pass the gum line and latch to finger.   Mom feeding plan breast and bottle supplementing with EBM and formula.   LPTI guidelines reviewed to reduce calorie loss including keeping total feeding under 30 min.   Plan 1. To feed based on cues 8-12x 24hr period. Mom to offer breasts and look for signs of milk transfer 2. Mom to supplement with EBM first followed by formula with slow flow nipple and pace bottle feeding. BF supplementation guide provided, if infant does not latch, parents to offer more.  3 DEBP q 3hrs for 15 min  4 I and O sheet reviewed.  All questions answered at the end of the visit.  Maternal Data Has patient been taught Hand Expression?: Yes  Feeding Mother's Current Feeding Choice: Breast Milk and Formula  LATCH Score                    Lactation Tools Discussed/Used Tools: Pump;Flanges Flange Size: 21;24 Breast pump type: Double-Electric Breast Pump Pump Education: Setup, frequency, and cleaning;Milk Storage Reason for Pumping: increase stimulation Pumping frequency: every 3 hrs for 15 min  Interventions Interventions: Breast feeding basics reviewed;Assisted with latch;Skin to skin;Breast massage;Hand express;Breast compression;Adjust position;Support pillows;Position options;Expressed milk;Hand pump;Education;Pace feeding;LC Magazine features editor;Infant Driven Feeding Algorithm education  Discharge Pump: Personal WIC Program: Yes  Consult Status Consult Status: Follow-up Date: 01/22/22 Follow-up  type: In-patient    Maylen Waltermire  Nicholson-Springer 01/21/2022, 3:14 PM

## 2022-01-21 NOTE — Discharge Summary (Signed)
Postpartum Discharge Summary       Patient Name: Diana Mcintyre DOB: [birth year] MRN: [id number]  Date of admission: 01/20/2022 Delivery date:01/21/2022  Delivering provider: Caren Macadam  Date of discharge: 01/23/2022  Admitting diagnosis: Mild pre-eclampsia, antepartum, third trimester [O14.03] Intrauterine pregnancy: [redacted]w[redacted]d    Secondary diagnosis:  Principal Problem:   Mild pre-eclampsia, antepartum, third trimester Active Problems:   Alpha thalassemia minor   Carrier of spinal muscular atrophy   Supervision of normal first pregnancy, antepartum   Marginal insertion of umbilical cord affecting management of mother in second trimester   Obesity in pregnancy, antepartum  Additional problems:     Discharge diagnosis: Term Pregnancy Delivered and Preeclampsia (mild)                                              Post partum procedures:none Augmentation: Cytotec, IP Foley, Pitocin but then it was turned off and she labored without augmentation  Complications: None  Hospital course: Onset of Labor With Vaginal Delivery      31y.o. yo G1P0000 at 361w1das admitted for IOL due to Preeclampsia without severe features on 01/20/2022. Patient had an uncomplicated labor course as follows:  Membrane Rupture Time/Date: 1:05 AM ,01/21/2022   Delivery Method:Vaginal, Spontaneous  Episiotomy: None  Lacerations:  1st degree  Patient had an uncomplicated postpartum course.  She is ambulating, tolerating a regular diet, passing flatus, and urinating well. Patient is discharged home in stable condition on 01/23/22.  Newborn Data: Birth date:01/21/2022  Birth time:8:52 AM  Gender:Female  Living status:Living  Apgars:8 ,9  Weight:2635 g   Magnesium Sulfate received: No BMZ received: No Rhophylac:N/A MMR:No T-DaP: Declined prenatally Flu: No Transfusion:No  Physical exam  Vitals:   01/22/22 2017 01/22/22 2333 01/23/22 0303 01/23/22 0815  BP: 133/84 134/85 129/75 118/70  Pulse: 91  81 91 75  Resp: _0 Temp: 98.2 F (36.8 C) 98.1 F (36.7 C) 98.3 F (36.8 C) 98.2 F (36.8 C)  TempSrc: Oral Oral Oral Oral  SpO2: 100% 100% 100% 99%  Weight:      Height:       General: alert, cooperative, and no distress Lochia: appropriate Uterine Fundus: firm Incision: Healing well with no significant drainage DVT Evaluation: No evidence of DVT seen on physical exam. Labs: Lab Results  Component Value Date   WBC 11.2 (H) 01/21/2022   HGB 10.6 (L) 01/21/2022   HCT 32.2 (L) 01/21/2022   MCV 78.3 (L) 01/21/2022   PLT 121 (L) 01/21/2022   CMP Latest Ref Rng & Units 01/21/2022  Glucose 70 - 99 mg/dL 89  BUN 6 - 20 mg/dL 8  Creatinine 0.44 - 1.00 mg/dL 0.97  Sodium 135 - 145 mmol/L 133(L)  Potassium 3.5 - 5.1 mmol/L 4.0  Chloride 98 - 111 mmol/L 105  CO2 22 - 32 mmol/L 20(L)  Calcium 8.9 - 10.3 mg/dL 8.9  Total Protein 6.5 - 8.1 g/dL 5.7(L)  Total Bilirubin 0.3 - 1.2 mg/dL 0.7  Alkaline Phos 38 - 126 U/L 96  AST 15 - 41 U/L 29  ALT 0 - 44 U/L 22   Edinburgh Score: Edinburgh Postnatal Depression Scale Screening Tool 01/21/2022  I have been able to laugh and see the funny side of things. (No Data)     After visit meds:  Allergies as of  01/23/2022   No Known Allergies      Medication List     TAKE these medications    Blood Pressure Kit Devi 1 Device by Does not apply route once a week.   fluticasone 50 MCG/ACT nasal spray Commonly known as: FLONASE Place 1 spray into both nostrils daily.   furosemide 20 MG tablet Commonly known as: LASIX Take 1 tablet (20 mg total) by mouth 2 (two) times daily.   ibuprofen 600 MG tablet Commonly known as: ADVIL Take 1 tablet (600 mg total) by mouth every 6 (six) hours.   multivitamin-prenatal 27-0.8 MG Tabs tablet Take 1 tablet by mouth daily at 12 noon.   NIFEdipine 30 MG 24 hr tablet Commonly known as: ADALAT CC Take 1 tablet (30 mg total) by mouth 2 (two) times daily.         Discharge home in  stable condition Infant Feeding: Bottle and Breast Infant Disposition:home with mother Discharge instruction: per After Visit Summary and Postpartum booklet. Activity: Advance as tolerated. Pelvic rest for 6 weeks.  Diet: routine diet Future Appointments: Future Appointments  Date Time Provider [place]  01/24/2022  1:30 PM Clemetine Marker [facility]  02/18/2022 10:15 AM [facility] [place]   Follow up Visit:  [place] [facility] Follow up in 1 week(s).   Specialty: Obstetrics and Gynecology Why: MyChart Connect visit, BP visit Contact information: [place] [phone]               Message sent by Lauretta Chester on 01/21/22 to [place] Please schedule this patient for a In person postpartum visit in 4 weeks with the following provider: MD and [facility] . Additional Postpartum F/U:BP check 1 week  High risk pregnancy complicated by: Mild preeclampsia Delivery mode:  Vaginal, Spontaneous  Anticipated Birth Control:   None   01/23/2022 Florian Buff, MD

## 2022-01-22 MED ORDER — NIFEDIPINE ER OSMOTIC RELEASE 30 MG PO TB24
30.0000 mg | ORAL_TABLET | Freq: Two times a day (BID) | ORAL | Status: DC
  Administered 2022-01-22 – 2022-01-23 (×2): 30 mg via ORAL
  Filled 2022-01-22 (×2): qty 1

## 2022-01-22 NOTE — Progress Notes (Signed)
Post Partum Day 1 Subjective: no complaints, up ad lib, voiding, and tolerating PO  Objective: Blood pressure 134/69, pulse 89, temperature 97.9 F (36.6 C), temperature source Oral, resp. rate 17, height 5\' 4"  (1.626 m), weight 106.8 kg, last menstrual period 04/29/2021, SpO2 100 %, unknown if currently breastfeeding. Vitals:   01/21/22 1547 01/21/22 1927 01/21/22 2344 01/22/22 0406  BP: 134/75 120/68 121/73 134/69  Pulse: (!) 102 95 80 89  Resp: 16 18 17 17   Temp: 98.1 F (36.7 C) 98.4 F (36.9 C) 97.7 F (36.5 C) 97.9 F (36.6 C)  TempSrc: Oral Oral  Oral  SpO2: 98% 100% 100% 100%  Weight:      Height:        Physical Exam:  General: alert, cooperative, and no distress Lochia: appropriate Uterine Fundus: firm Incision:  DVT Evaluation: No evidence of DVT seen on physical exam.  Recent Labs    01/21/22 0459 01/21/22 0943  HGB 10.7* 10.6*  HCT 33.7* 32.2*    Assessment/Plan: Continue magnesium til noon Procardia xl 30 daily Lasix 20 BID  Assess for discharge tomorrow   LOS: 2 days   Florian Buff 01/22/2022, 7:39 AM

## 2022-01-22 NOTE — Lactation Note (Addendum)
This note was copied from a baby's chart. Lactation Consultation Note  Patient Name: Diana Mcintyre [id number] Date: 01/22/2022 Reason for consult: Follow-up assessment;Mother's request;Primapara;1st time breastfeeding;Early term 37-38.6wks;Infant < 6lbs;Breastfeeding assistance Age:30 hours   Mom mostly offering formula. [place] asked if she wanted assistance latching at the breast, she declined. Mom pumped x1 getting small amount. Mom denied any pain with pumping.   LC reviewed supplementing volume since infant not latching at the breast, parents can offer more. Volume guide based on hrs of age since delivery provided.   Parents aware to feed 8-12x 24hr period. If not latching at breast to offer more volume. Mom encouraged to post pump with DEBP q 3hrs for 15 min  Infant had adequate urine and stool output.  All questions answered at the end of the visit.  Parents stated follow up with [facility] on discharge for further support.   Mom Diana Mcintyre pump at home.      Maternal Data    Feeding Mother's Current Feeding Choice: Breast Milk and Formula Nipple Type: Slow - flow  LATCH Score                    Lactation Tools Discussed/Used Tools: Pump;Flanges Breast pump type: Double-Electric Breast Pump Pump Education: Setup, frequency, and cleaning;Milk Storage Reason for Pumping: increase stimulaton Pumping frequency: every 3 hrs for 15 min  Interventions Interventions: Breast feeding basics reviewed;Expressed milk;DEBP;Education;Infant Driven Feeding Algorithm education;Pace feeding  Discharge    Consult Status Consult Status: Follow-up Date: 01/23/22 Follow-up type: In-patient    Diana Rothschild  Mcintyre 01/22/2022, 3:47 PM

## 2022-01-23 MED ORDER — IBUPROFEN 600 MG PO TABS: 600.0000 mg | ORAL_TABLET | Freq: Four times a day (QID) | ORAL | 0 refills | Status: DC

## 2022-01-23 MED ORDER — NIFEDIPINE ER 30 MG PO TB24: 30.0000 mg | ORAL_TABLET | Freq: Two times a day (BID) | ORAL | 1 refills | Status: AC

## 2022-01-23 MED ORDER — FUROSEMIDE 20 MG PO TABS: 20.0000 mg | ORAL_TABLET | Freq: Two times a day (BID) | ORAL | 0 refills | Status: DC

## 2022-01-24 ENCOUNTER — Encounter: Payer: Self-pay | Admitting: Family Medicine

## 2022-01-24 ENCOUNTER — Other Ambulatory Visit: Payer: Self-pay

## 2022-01-24 ENCOUNTER — Ambulatory Visit (INDEPENDENT_AMBULATORY_CARE_PROVIDER_SITE_OTHER): Payer: Medicaid Other | Admitting: Family Medicine

## 2022-01-24 DIAGNOSIS — O165 Unspecified maternal hypertension, complicating the puerperium: Secondary | ICD-10-CM | POA: Diagnosis not present

## 2022-01-24 DIAGNOSIS — O1414 Severe pre-eclampsia complicating childbirth: Secondary | ICD-10-CM

## 2022-01-24 MED ORDER — BLOOD PRESSURE KIT DEVI
1.0000 | Freq: Every day | 0 refills | Status: DC
Start: 2022-01-24 — End: 2022-02-18

## 2022-01-24 NOTE — Patient Instructions (Signed)
Please check your blood pressure about once a day, usually after relaxing with both of your feet on the ground.   Goal is BP <130/80. If persistently high, please let us know and follow up sooner.   If persistent headache not better with tylenol/ibuprofen, persistent blurred vision, or BP staying above 160/110-- please be seen (can go to the MAU/maternity assessment unit)/

## 2022-01-24 NOTE — Progress Notes (Signed)
° ° °  POSTPARTUM PROBLEM  VISIT ENCOUNTER NOTE  Subjective:   Diana Mcintyre is a 31 y.o. G37P1001 female here for a problem visit.  Pregnancy was complicated by pre-eclampsia with severe features on Mg for 24 hours postpartum.  Patient had Vaginal, no problems at delivery 3 days ago.  Since delivery patient reports she has been feeling good. Denies any headache, blurred vision, or severe abdominal pain. She has been taking procardia XL BID and lasix 20mg  BID. Has not been checking BP at home.   Infant feeding: Patient is providing breastmilk, but predominantly formula feeding.   Current complaints: None.    Contraception: none, same sex partner.   Health Maintenance Due  Topic Date Due   TETANUS/TDAP  Never done    The following portions of the patient's history were reviewed and updated as appropriate: allergies, current medications, past family history, past medical history, past social history, past surgical history and problem list.   Objective:  BP 133/88    Pulse 88    LMP  (LMP Unknown)    Breastfeeding Yes  Gen: well appearing, NAD HEENT: no scleral icterus CV: RR Lung: Normal WOB Ext: warm well perfused, 1+ pitting edema to mid shin bilaterally   Breast: not indicated GU: not indicated  Assessment and Plan:   1. Vaginal delivery 2. Severe pre-eclampsia, with delivery (s/p 24 hours of Mg)  3. Postpartum hypertension  BP slightly above goal 130/80 today. Sent script for BP cuff to pick up today and enrolled into postpartum Baby Scripts. Cont procardia 30 XL BID and lasix BID as prescribed for now. If persistently above >130/80 at home, will add second agent (likely HCTZ vs lisinopril). MAU precautions discussed.     Please refer to After Visit Summary for other counseling recommendations.   Follow up for postpartum visit or sooner if BP uncontrolled. Will be in the office in 10 days for her newborn's visit.   Patriciaann Clan, [place] for  [facility]

## 2022-01-27 ENCOUNTER — Inpatient Hospital Stay (HOSPITAL_COMMUNITY): Payer: Medicaid Other

## 2022-01-28 ENCOUNTER — Encounter: Payer: Self-pay | Admitting: Radiology

## 2022-02-01 ENCOUNTER — Telehealth (HOSPITAL_COMMUNITY): Payer: Self-pay | Admitting: *Deleted

## 2022-02-01 NOTE — Telephone Encounter (Signed)
Patient voiced no questions or concerns at this time. EPDS=0. Patient voiced no questions or concerns regarding infant at this time. Patient reports infant sleeps in a bassinet on her back. RN reviewed ABCs of safe sleep. Patient verbalized understanding. Patient requested RN email information on hospital's virtual postpartum classes and support groups. Email sent. Erline Levine, RN, 02/01/22, [phone]

## 2022-02-18 ENCOUNTER — Ambulatory Visit (INDEPENDENT_AMBULATORY_CARE_PROVIDER_SITE_OTHER): Payer: Medicaid Other | Admitting: Family Medicine

## 2022-02-18 ENCOUNTER — Other Ambulatory Visit: Payer: Self-pay

## 2022-02-18 DIAGNOSIS — Z8759 Personal history of other complications of pregnancy, childbirth and the puerperium: Secondary | ICD-10-CM | POA: Diagnosis not present

## 2022-02-18 NOTE — Progress Notes (Signed)
? ? ?Post Partum Visit Note ? ?Diana Mcintyre is a 31 y.o. G67P1001 female who presents for a postpartum visit. She is 4 weeks postpartum following a normal spontaneous vaginal delivery.  I have fully reviewed the prenatal and intrapartum course. The delivery was at 38.1 gestational weeks.  Anesthesia: epidural. Postpartum course has been uneventful. Baby is doing well. Baby is feeding by both breast and bottle - Similac Neosure. Bleeding no bleeding. Bowel function is abnormal: Constipation . Bladder function is normal. Patient is not sexually active. Contraception method is none. Postpartum depression screening: negative. ? ? ?The pregnancy intention screening data noted above was reviewed. Potential methods of contraception were discussed. The patient elected to proceed with No data recorded. ? ? Edinburgh Postnatal Depression Scale - 02/18/22 1026   ? ?  ? Edinburgh Postnatal Depression Scale:  In the Past 7 Days  ? I have been able to laugh and see the funny side of things. 0   ? I have looked forward with enjoyment to things. 0   ? I have blamed myself unnecessarily when things went wrong. 0   ? I have been anxious or worried for no good reason. 0   ? I have felt scared or panicky for no good reason. 0   ? Things have been getting on top of me. 0   ? I have been so unhappy that I have had difficulty sleeping. 0   ? I have felt sad or miserable. 0   ? I have been so unhappy that I have been crying. 0   ? The thought of harming myself has occurred to me. 0   ? Edinburgh Postnatal Depression Scale Total 0   ? ?  ?  ? ?  ? ? ?Health Maintenance Due  ?Topic Date Due  ? TETANUS/TDAP  Never done  ? ? ?The following portions of the patient's history were reviewed and updated as appropriate: allergies, current medications, past family history, past medical history, past social history, past surgical history, and problem list. ? ?Review of Systems ?Pertinent items noted in HPI and remainder of comprehensive ROS  otherwise negative. ? ?Objective:  ?BP 115/80   Pulse (!) 114   Wt 211 lb 3.2 oz (95.8 kg)   LMP  (LMP Unknown)   Breastfeeding Yes   BMI 36.25 kg/m?   ? ?General:  alert, cooperative, and appears stated age  ? Breasts:  not indicated  ?Lungs: Comfortable on room air  ?GU exam:  not indicated  ?     ?Assessment:  ? ? There are no diagnoses linked to this encounter. ? ?Normal postpartum exam.  ? ?Plan:  ? ?Essential components of care per ACOG recommendations: ? ?1.  Mood and well being: Patient with negative depression screening today. Reviewed local resources for support.  ?- Patient tobacco use? No.   ?- hx of drug use? No.   ? ?2. Infant care and feeding:  ?-Patient currently breastmilk feeding? Yes. Reviewed importance of draining breast regularly to support lactation.  ?-Social determinants of health (SDOH) reviewed in EPIC. No concerns ? ?3. Sexuality, contraception and birth spacing ?- Patient does not want a pregnancy in the next year.  Desired family size is 1 children.  ?- Reviewed reproductive life planning. Reviewed contraceptive methods based on pt preferences and effectiveness.  Patient desired  no contraception  today-same sex partner, IVF pregnancy.   ?- Discussed birth spacing of 18 months ? ?4. Sleep and fatigue ?-Encouraged family/partner/community support of  4 hrs of uninterrupted sleep to help with mood and fatigue ? ?5. Physical Recovery  ?- Discussed patients delivery and complications. She describes her labor as mixed. ?- Patient had a  vaginal delivery . Patient had a 1st degree laceration. Perineal healing reviewed. Patient expressed understanding ?- Patient has urinary incontinence? No. ?- Patient is safe to resume physical and sexual activity ? ?6.  Health Maintenance ?- HM due items addressed No - up to date ?- Last pap smear  ?Diagnosis  ?Date Value Ref Range Status  ?08/18/2021   Final  ? - Negative for intraepithelial lesion or malignancy (NILM)  ? Pap smear not done at today's  visit.  ?-Breast Cancer screening indicated? No.  ? ?7. Chronic Disease/Pregnancy Condition follow up: Hypertension ?- Severe PreE, has been taking Nifedipine. Instructed to stop, will do nurse visit BP check in 1 week.  ?- PCP follow up ? ?Clarnce Flock, MD ?[facility] [place] ? ?

## 2022-02-25 ENCOUNTER — Other Ambulatory Visit: Payer: Self-pay

## 2022-02-25 ENCOUNTER — Ambulatory Visit (INDEPENDENT_AMBULATORY_CARE_PROVIDER_SITE_OTHER): Payer: Medicaid Other | Admitting: *Deleted

## 2022-02-25 VITALS — BP 118/72 | HR 73 | Ht 64.0 in | Wt 209.3 lb

## 2022-02-25 DIAGNOSIS — Z0130 Encounter for examination of blood pressure without abnormal findings: Secondary | ICD-10-CM

## 2022-02-25 NOTE — Progress Notes (Signed)
Here for 1 week bp check after stopping NIfedipine as instructed at her postpartum exam. Denies headaches or any issues since she has stopped. BP wnl at 118/72. Advised to take blood pressure weekly for a few weeks and if elevated to contact PCP and if can't be seen quickly to contact us. ?She voices understanding. ?Staci Acosta ?

## 2022-02-25 NOTE — Progress Notes (Signed)
Chart reviewed for nurse visit. Agree with plan of care.  ? ?Clarnce Flock, MD ?02/25/22 11:04 AM ?

## 2022-02-28 NOTE — Progress Notes (Signed)
Patient was assessed and managed by nursing staff during this encounter. I have reviewed the chart and agree with the documentation and plan. I have also made any necessary editorial changes. ? ?Griffin Basil, MD ?02/28/2022 1:26 PM   ?

## 2022-05-06 ENCOUNTER — Ambulatory Visit (HOSPITAL_COMMUNITY)
Admission: EM | Admit: 2022-05-06 | Discharge: 2022-05-06 | Disposition: A | Discharge status: Home / Self Care | Payer: Medicaid Other | Attending: Physician Assistant | Admitting: Physician Assistant

## 2022-05-06 ENCOUNTER — Ambulatory Visit (INDEPENDENT_AMBULATORY_CARE_PROVIDER_SITE_OTHER): Payer: Medicaid Other

## 2022-05-06 ENCOUNTER — Encounter (HOSPITAL_COMMUNITY): Payer: Self-pay | Admitting: Emergency Medicine

## 2022-05-06 DIAGNOSIS — M654 Radial styloid tenosynovitis [de Quervain]: Secondary | ICD-10-CM | POA: Diagnosis not present

## 2022-05-06 DIAGNOSIS — M25532 Pain in left wrist: Secondary | ICD-10-CM

## 2022-05-06 MED ORDER — NAPROXEN 500 MG PO TABS: 500.0000 mg | ORAL_TABLET | Freq: Two times a day (BID) | ORAL | 0 refills | Status: AC

## 2022-05-06 NOTE — ED Provider Notes (Addendum)
[facility]    CSN: [id number] Arrival date & time: 05/06/22  8786      History   Chief Complaint Chief Complaint  Patient presents with   Wrist Pain   Insect Bite    HPI Diana Mcintyre is a 31 y.o. female.   Patient presents today with a several day history of left radial wrist pain.  She denies any known injury prior to symptom onset or recent trauma but does report that symptoms began soon after she had been lifting weights.  She is right-handed.  She denies any numbness or paresthesias.  She reports pain is rated 5 on a 0-10 pain scale, described as aching, worse with certain movements, no alleviating factors identified.  She has not tried any over-the-counter medication or conservative treatment measures.  She is having difficulty with daily activities as result of symptoms.  Patient is confident she is not pregnant and is not breast-feeding.  In addition, patient reports an area of discoloration on her left forearm.  She denies known injury or insect bite.  She reports no significant pain or pruritus.  She has not been applying any medication to help manage this.  Reports it has gradually been improving since she first noticed it approximately 2 days ago and is less noticeable today.   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 Authorizing Provider  naproxen (NAPROSYN) 500 MG  tablet Take 1 tablet (500 mg total) by mouth 2 (two) times daily. 05/06/22  Yes Charlis Harner K, PA-C  fluticasone (FLONASE) 50 MCG/ACT nasal spray Place 1 spray into both nostrils daily. 01/18/22   Clarnce Flock, MD  NIFEdipine (ADALAT CC) 30 MG 24 hr tablet Take 1 tablet (30 mg total) by mouth 2 (two) times daily. Patient not taking: Reported on 02/25/2022 01/23/22   Florian Buff, MD    Family History Family History  Adopted: Yes  Problem Relation Age of Onset   Diabetes Paternal Grandfather    Cancer Mother    Diabetes Sister     Social History Social History   Tobacco Use   Smoking status: Never   Smokeless tobacco: Never  Vaping Use   Vaping Use: Never used  Substance Use Topics   Alcohol use: Not Currently    Alcohol/week: 1.0 standard drink    Types: 1 Glasses of wine per week   Drug use: Never     Allergies   Patient has no known allergies.   Review of Systems Review of Systems  Constitutional:  Positive for activity change. Negative for appetite change, fatigue and fever.  Musculoskeletal:  Positive for arthralgias and myalgias.  Skin:  Positive for color change. Negative for wound.  Neurological:  Negative for weakness and numbness.    Physical Exam Triage Vital Signs ED Triage Vitals  Enc Vitals Group     BP 05/06/22 0900 127/80     Pulse Rate 05/06/22 0900 72     Resp 05/06/22 0900 17     Temp 05/06/22 0900 98.3 F (36.8 C)     Temp src --      SpO2 05/06/22 0900 100 %     Weight --      Height --      Head Circumference --      Peak Flow --      Pain Score 05/06/22 0859 5     Pain Loc --      Pain Edu? --      Excl. in [place]? --    No data found.  Updated Vital Signs BP 127/80   Pulse 72   Temp 98.3 F (36.8 C)   Resp 17   SpO2 100%   Breastfeeding No   Visual Acuity Right Eye Distance:   Left Eye Distance:   Bilateral Distance:    Right Eye Near:   Left Eye Near:    Bilateral Near:     Physical Exam Vitals reviewed.   Constitutional:      General: She is awake. She is not in acute distress.    Appearance: Normal appearance. She is well-developed. She is not ill-appearing.     Comments: Very pleasant female appears stated age in no acute distress sitting comfortably in exam room.  HENT:     Head: Normocephalic and atraumatic.  Cardiovascular:     Rate and Rhythm: Normal rate and regular rhythm.     Heart sounds: Normal heart sounds, S1 normal and S2 normal. No murmur heard. Pulmonary:     Effort: Pulmonary effort is normal.     Breath sounds: Normal breath sounds. No wheezing, rhonchi or rales.     Comments: Clear to auscultation bilaterally Abdominal:     Palpations: Abdomen is soft.     Tenderness: There is no abdominal tenderness.  Musculoskeletal:     Left wrist: Tenderness and bony tenderness present. No swelling or snuff box tenderness. Decreased range of motion.     Left hand: No swelling. Normal range of motion. Normal sensation. There is no disruption of two-point discrimination.     Comments: Left wrist/hand: Decreased range of motion with extension and radial deviation of left wrist secondary to pain.  No significant swelling or deformity noted.  No snuffbox tenderness.  Hand neurovascularly intact based on two-point discrimination.  Normal pincer and grip strength.  Skin:    Findings: Bruising present.          Comments: 5 cm x 3 cm bruise noted left forearm  Psychiatric:        Behavior: Behavior is cooperative.     UC Treatments / Results  Labs (all labs ordered are listed, but only abnormal results are displayed) Labs Reviewed - No data to display  EKG   Radiology DG Wrist Complete Left  Result Date: 05/06/2022 CLINICAL DATA:  Left wrist pain. EXAM: LEFT WRIST - COMPLETE 3+ VIEW COMPARISON:  None Available. FINDINGS: There is no evidence of fracture or dislocation. There is no evidence of arthropathy or other focal bone abnormality. Soft tissues are unremarkable.  IMPRESSION: Negative. Electronically Signed   By: [place] Service.D.  On: 05/06/2022 09:43    Procedures Procedures (including critical care time)  Medications Ordered in UC Medications - No data to display  Initial Impression / Assessment and Plan / UC Course  I have reviewed the triage vital signs and the nursing notes.  Pertinent labs & imaging results that were available during my care of the patient were reviewed by me and considered in my medical decision making (see chart for details).     X-ray obtained given tenderness palpation over wrist which showed no acute osseous abnormality.  Discussed symptoms are most consistent with de Quervain's tenosynovitis.  Patient was placed in a brace for comfort and encouraged to use RICE protocol for additional symptom relief.  She was started on Naprosyn twice daily with instruction not to take additional NSAIDs with this medication due to risk of GI bleeding.  Can use Tylenol for breakthrough pain.  If symptoms or not improving she is to follow-up with sports medicine was given contact information for local provider with instruction to call to schedule an appointment.  If anything worsen she is to return for reevaluation.  Work excuse note provided.  Discoloration appears to be benign bruising.  Recommended that she monitor this area given it is already started improving.  If anything worsens and it spreads or changes character she is to return for reevaluation to which she expressed understanding.  Final Clinical Impressions(s) / UC Diagnoses   Final diagnoses:  Left wrist pain  De Quervain's tenosynovitis, left     Discharge Instructions      Your x-ray was normal.  I believe that you have inflammation of your tendon causing your pain.  Please avoid heavy lifting.  Use the brace to help with your symptoms.  You can also use elevation and ice for symptom relief.  Take Naprosyn twice daily.  Do not take NSAIDs including aspirin,  ibuprofen/Advil, naproxen/Aleve with this medication as it can cause stomach bleeding.  You can use Tylenol for breakthrough pain.  If your symptoms or not improving quickly please follow-up with sports medicine; call to schedule an appointment.  If anything worsens please return for reevaluation.     ED Prescriptions     Medication Sig Dispense Auth. Provider   naproxen (NAPROSYN) 500 MG tablet Take 1 tablet (500 mg total) by mouth 2 (two) times daily. 30 tablet Aras Albarran, Derry Skill, PA-C      PDMP not reviewed this encounter.   Terrilee Croak, PA-C 05/06/22 1017    Kirat Mezquita K, PA-C 05/06/22 1017

## 2022-05-06 NOTE — ED Triage Notes (Signed)
Pt is present today with left wrist pain. Pt states that she noticed the pain after lifting weights five days ago.   Pt also has concerns for a possible bug bite on her left forearm

## 2022-05-06 NOTE — Discharge Instructions (Signed)
Your x-ray was normal.  I believe that you have inflammation of your tendon causing your pain.  Please avoid heavy lifting.  Use the brace to help with your symptoms.  You can also use elevation and ice for symptom relief.  Take Naprosyn twice daily.  Do not take NSAIDs including aspirin, ibuprofen/Advil, naproxen/Aleve with this medication as it can cause stomach bleeding.  You can use Tylenol for breakthrough pain.  If your symptoms or not improving quickly please follow-up with sports medicine; call to schedule an appointment.  If anything worsens please return for reevaluation.

## 2023-04-28 ENCOUNTER — Other Ambulatory Visit: Payer: Self-pay | Admitting: Family Medicine

## 2023-04-28 MED ORDER — FERROUS SULFATE 325 (65 FE) MG PO TBEC: 325.0000 mg | DELAYED_RELEASE_TABLET | ORAL | 2 refills | Status: AC

## 2023-04-28 NOTE — Progress Notes (Signed)
Reporting PICA with ice at daughters [facility]. Declines H&H check. Empiric oral iron therapy sent to pharmacy.

## 2023-11-26 IMAGING — DX DG WRIST COMPLETE 3+V*L*
4 series · 4 of 4 positions shown · non-contrast
Comparison: None Available.

CLINICAL DATA: Left wrist pain.

EXAM:
LEFT WRIST - COMPLETE 3+ VIEW

[wrist pa]
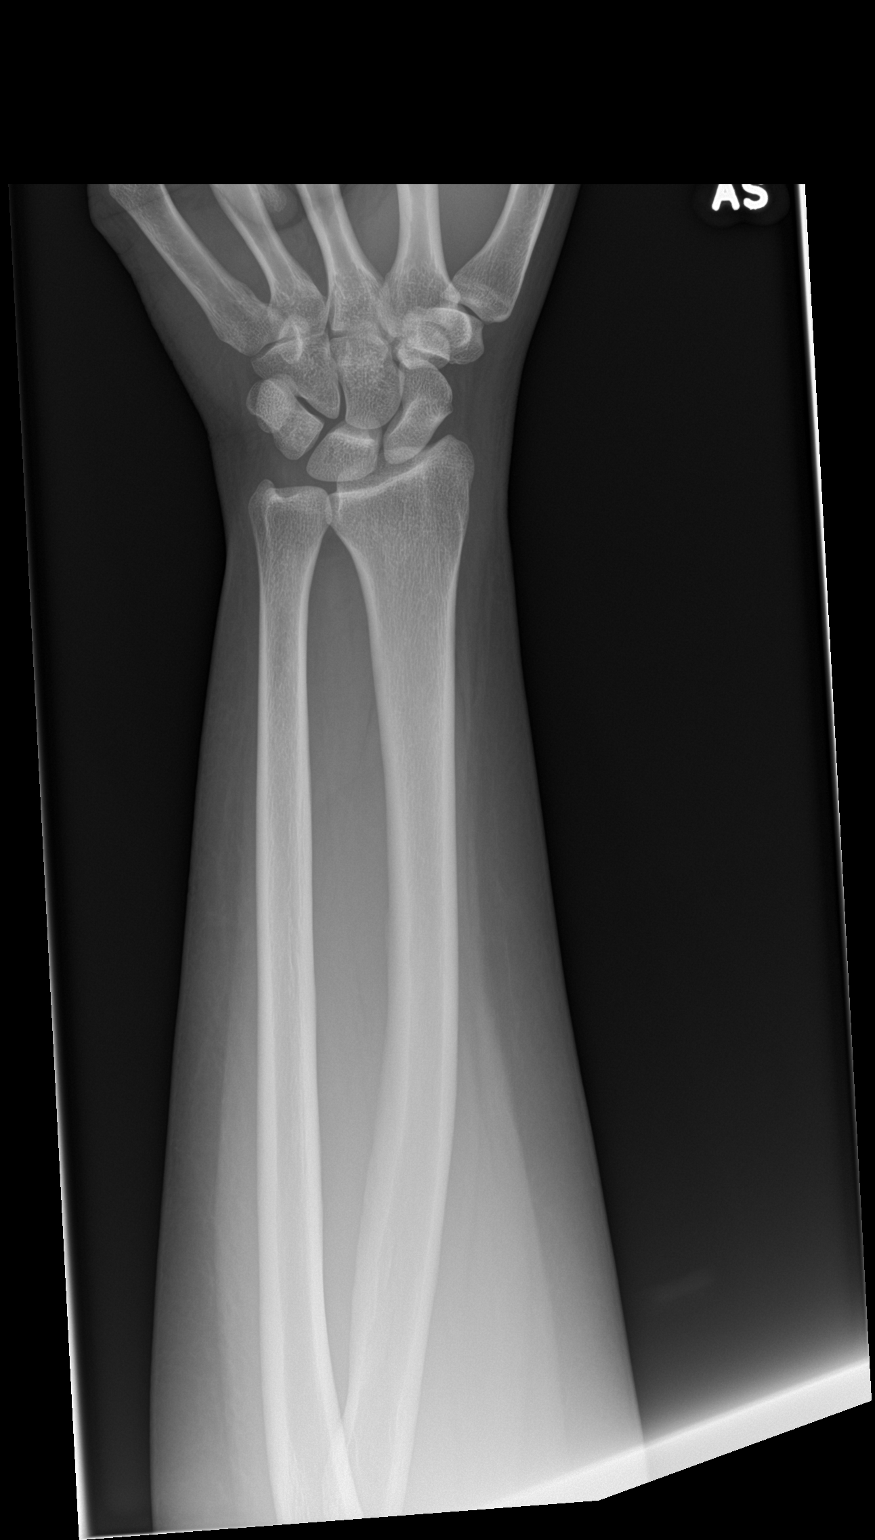

[wrist navicular]
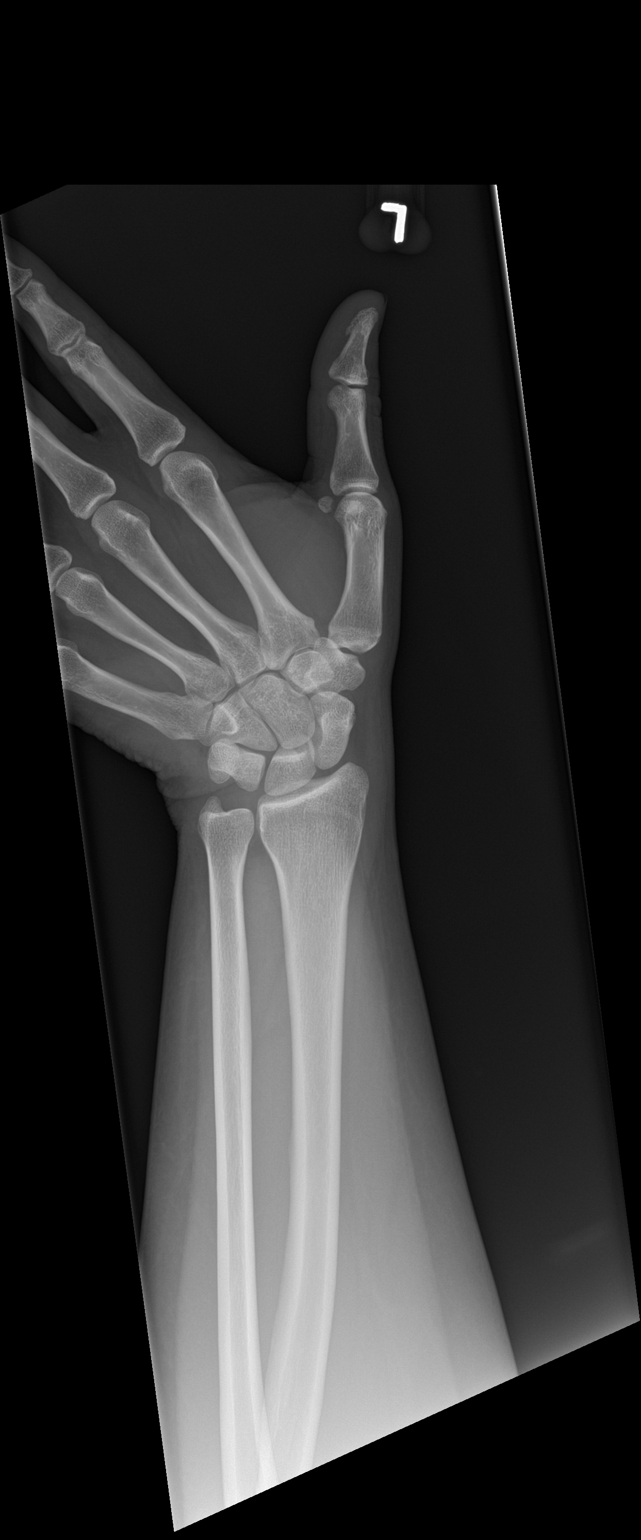

[wrist obl]
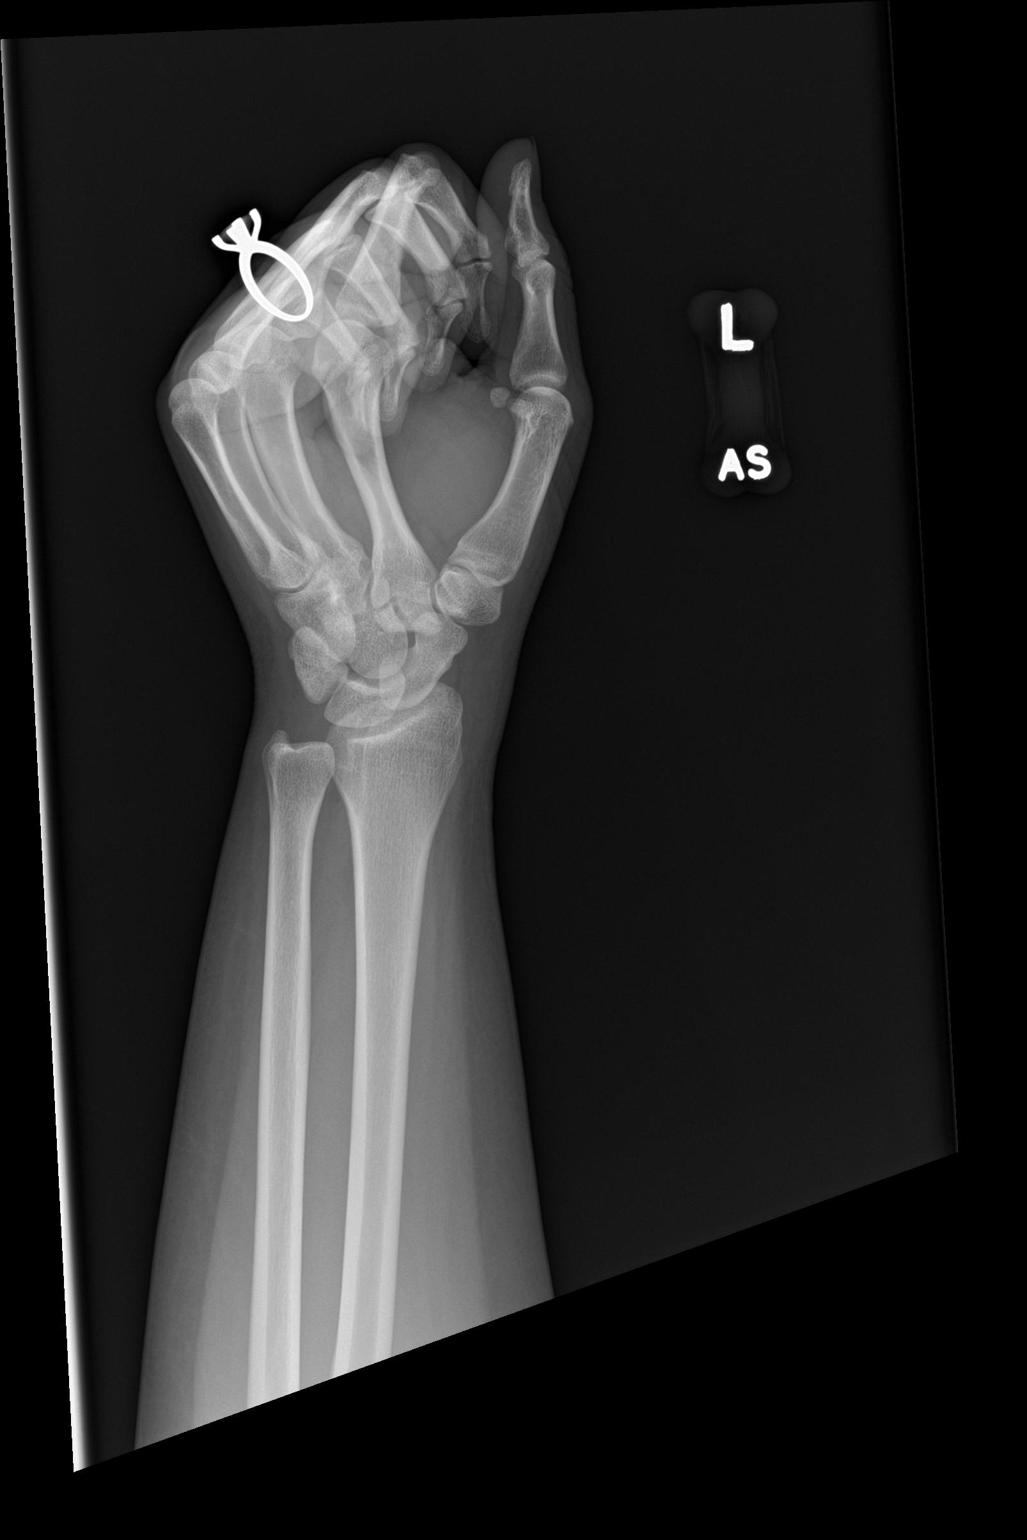

[wrist lat]
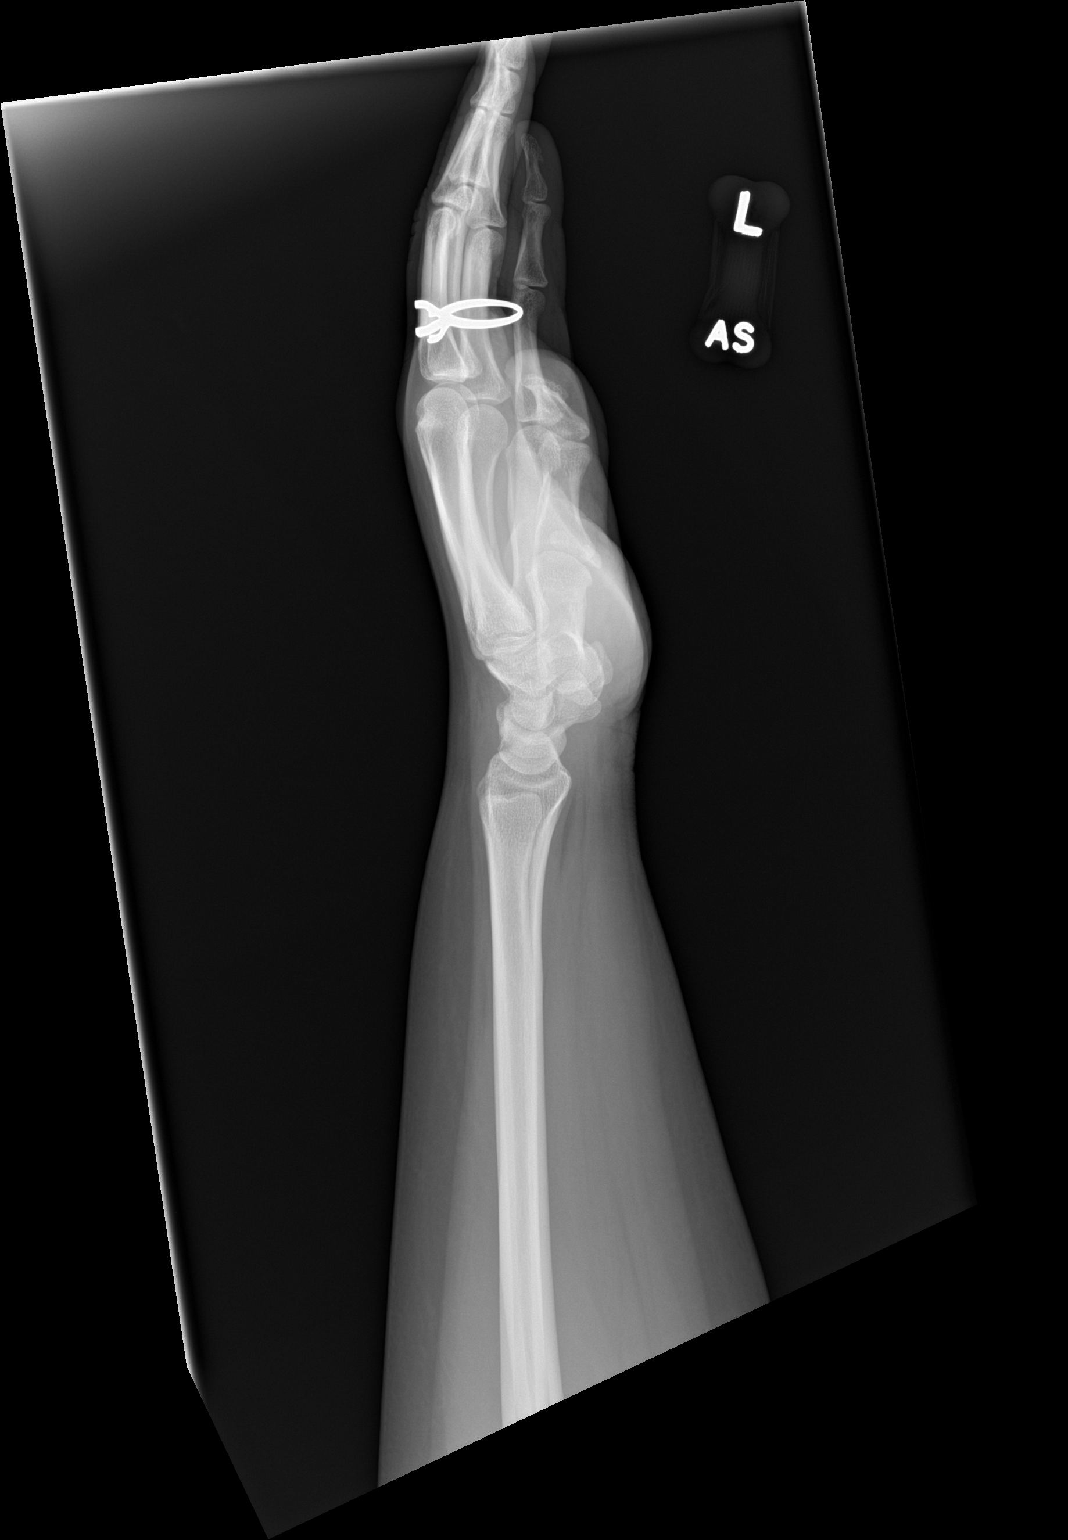

[4 of 4 positions shown; findings below may reference images not displayed]

FINDINGS: There is no evidence of fracture or dislocation. There is no
evidence of arthropathy or other focal bone abnormality. Soft
tissues are unremarkable.
IMPRESSION: Negative.

## 2025-01-14 ENCOUNTER — Encounter (HOSPITAL_COMMUNITY): Payer: Self-pay

## 2025-01-14 ENCOUNTER — Ambulatory Visit (HOSPITAL_COMMUNITY)
Admission: EM | Admit: 2025-01-14 | Discharge: 2025-01-14 | Disposition: A | Discharge status: Home / Self Care | Attending: Emergency Medicine | Admitting: Emergency Medicine

## 2025-01-14 DIAGNOSIS — J069 Acute upper respiratory infection, unspecified: Secondary | ICD-10-CM

## 2025-01-14 DIAGNOSIS — R509 Fever, unspecified: Secondary | ICD-10-CM

## 2025-01-14 LAB — POCT INFLUENZA A/B
Influenza A, POC: NEGATIVE
Influenza B, POC: NEGATIVE

## 2025-01-14 NOTE — ED Notes (Signed)
 Reviewed work note, states understanding

## 2025-01-14 NOTE — ED Provider Notes (Signed)
 " [facility]    CSN: [id number] Arrival date & time: 01/14/25  1141      History   Chief Complaint Chief Complaint  Patient presents with   Shortness of Breath    HPI Diana Mcintyre is a 34 y.o. female.   Patient presents to clinic over concern of shortness of breath with standing that started this morning.   Daughter has URI last week and she has been home with her, recent exposure to flu-like illness Coughing since last week, fever a few days ago  Having some cold sweats and night sweats  Shortness of breath she noticed this morning, has not had any wheezing or trouble breathing Kind of feels wobbly when standing  Taking tylenol  cold and flu    The history is provided by the patient and medical records.  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 Authorizing Provider  ferrous sulfate  325 (65 FE) MG EC tablet Take 1 tablet (325 mg total) by mouth every other day. 04/28/23 10/25/23  Lola Donnice HERO, MD  fluticasone  (FLONASE ) 50 MCG/ACT nasal spray Place 1 spray into both nostrils daily. 01/18/22   Lola Donnice HERO, MD  naproxen  (NAPROSYN ) 500 MG tablet Take 1 tablet (500 mg total) by mouth 2 (two) times daily. 05/06/22   Raspet, Erin K, PA-C  NIFEdipine  (ADALAT  CC) 30 MG 24 hr tablet Take 1 tablet (30 mg total) by mouth 2 (two) times daily. Patient not  taking: Reported on 02/25/2022 01/23/22   Jayne Vonn DEL, MD    Family History Family History  Adopted: Yes  Problem Relation Age of Onset   Diabetes Paternal Grandfather    Cancer Mother    Diabetes Sister     Social History Social History[1]   Allergies   Patient has no known allergies.   Review of Systems Review of Systems  Per HPI  Physical Exam Triage Vital Signs ED Triage Vitals  Encounter Vitals Group     BP 01/14/25 1400 108/79     Girls Systolic BP Percentile --      Girls Diastolic BP Percentile --      Boys Systolic BP Percentile --      Boys Diastolic BP Percentile --      Pulse Rate 01/14/25 1358 71     Resp 01/14/25 1358 18     Temp 01/14/25 1358 97.7 F (36.5 C)     Temp src --      SpO2 01/14/25 1358 97 %     Weight --      Height --  Head Circumference --      Peak Flow --      Pain Score 01/14/25 1357 0     Pain Loc --      Pain Education --      Exclude from Growth Chart --    No data found.  Updated Vital Signs BP 108/79   Pulse 71   Temp 97.7 F (36.5 C)   Resp 18   LMP  (LMP Unknown)   SpO2 97%   Visual Acuity Right Eye Distance:   Left Eye Distance:   Bilateral Distance:    Right Eye Near:   Left Eye Near:    Bilateral Near:     Physical Exam Vitals and nursing note reviewed.  Constitutional:      Appearance: Normal appearance. She is well-developed.  HENT:     Head: Normocephalic and atraumatic.     Right Ear: External ear normal.     Left Ear: External ear normal.     Nose: Nose normal.     Mouth/Throat:     Mouth: Mucous membranes are moist.  Eyes:     Conjunctiva/sclera: Conjunctivae normal.  Cardiovascular:     Rate and Rhythm: Normal rate and regular rhythm.     Heart sounds: Normal heart sounds. No murmur heard. Pulmonary:     Effort: Pulmonary effort is normal.     Breath sounds: Normal breath sounds. No decreased breath sounds or wheezing.  Neurological:     General: No focal deficit present.      Mental Status: She is alert.  Psychiatric:        Mood and Affect: Mood normal.      UC Treatments / Results  Labs (all labs ordered are listed, but only abnormal results are displayed) Labs Reviewed  POCT INFLUENZA A/B    EKG   Radiology No results found.  Procedures Procedures (including critical care time)  Medications Ordered in UC Medications - No data to display  Initial Impression / Assessment and Plan / UC Course  I have reviewed the triage vital signs and the nursing notes.  Pertinent labs & imaging results that were available during my care of the patient were reviewed by me and considered in my medical decision making (see chart for details).  Vitals and triage reviewed, patient is hemodynamically stable.  Lungs vesicular, heart with regular rate and rhythm.  Influenza testing negative.  No acute distress, able to speak in full sentences.  Without wheezing.  Discussed that symptoms are consistent with a viral URI.  Strict return precautions given if symptoms evolve or worsen, can consider chest x-ray and further evaluation if no improvement.  Plan of care, follow-up care, and return precautions given, no questions at this time.  Work note provided.    Final Clinical Impressions(s) / UC Diagnoses   Final diagnoses:  Fever, unspecified  Viral URI with cough     Discharge Instructions      Flu testing was negative, he most likely have a different viral illness.  Alternate between 650 mg of Tylenol  and 800 mg of ibuprofen  every 4-6 hours to help with any fever, body aches or chills.  Over-the-counter cough medicine can help soothe the cough.  Ensure you are staying well-hydrated and getting plenty of rest.  Viral illnesses typically last 5 to 7 days.  For any new concerning symptoms or prolonged symptoms seek follow-up care.      ED Prescriptions   None    PDMP not reviewed  this encounter.     [1]  Social History Tobacco Use   Smoking  status: Never   Smokeless tobacco: Never  Vaping Use   Vaping status: Never Used  Substance Use Topics   Alcohol use: Not Currently    Alcohol/week: 1.0 standard drink of alcohol    Types: 1 Glasses of wine per week   Drug use: Never     Mercer, Maggie Dworkin  G, FNP 01/14/25 1501  "

## 2025-01-14 NOTE — ED Triage Notes (Signed)
 Pt present with SOB that started last  night and continued to today. Pt states she is not coughing as much as she was last week. States she had a fever x 2 days. Pt states she is sweating a lot when she sleeps.

## 2025-01-14 NOTE — Discharge Instructions (Signed)
 Flu testing was negative, he most likely have a different viral illness.  Alternate between 650 mg of Tylenol  and 800 mg of ibuprofen  every 4-6 hours to help with any fever, body aches or chills.  Over-the-counter cough medicine can help soothe the cough.  Ensure you are staying well-hydrated and getting plenty of rest.  Viral illnesses typically last 5 to 7 days.  For any new concerning symptoms or prolonged symptoms seek follow-up care.
